# Patient Record
Sex: Male | Born: 1960 | Race: Black or African American | Hispanic: No | Marital: Single | Smoking: Never smoker
Health system: Southern US, Community
[De-identification: ages and names within clinical notes are randomized; demographics above are authoritative.]

## PROBLEM LIST (undated history)

## (undated) DIAGNOSIS — F329 Major depressive disorder, single episode, unspecified: Secondary | ICD-10-CM

## (undated) DIAGNOSIS — F32A Depression, unspecified: Secondary | ICD-10-CM

## (undated) DIAGNOSIS — K649 Unspecified hemorrhoids: Secondary | ICD-10-CM

## (undated) DIAGNOSIS — J45909 Unspecified asthma, uncomplicated: Secondary | ICD-10-CM

## (undated) DIAGNOSIS — E0590 Thyrotoxicosis, unspecified without thyrotoxic crisis or storm: Principal | ICD-10-CM

## (undated) DIAGNOSIS — T7840XA Allergy, unspecified, initial encounter: Secondary | ICD-10-CM

## (undated) HISTORY — DX: Depression, unspecified: F32.A

## (undated) HISTORY — DX: Thyrotoxicosis, unspecified without thyrotoxic crisis or storm: E05.90

## (undated) HISTORY — PX: TOE AMPUTATION: SHX809

## (undated) HISTORY — DX: Unspecified hemorrhoids: K64.9

## (undated) HISTORY — DX: Major depressive disorder, single episode, unspecified: F32.9

## (undated) HISTORY — PX: HEMORRHOID SURGERY: SHX153

## (undated) HISTORY — PX: ABDOMINAL HERNIA REPAIR: SHX539

## (undated) HISTORY — DX: Allergy, unspecified, initial encounter: T78.40XA

## (undated) HISTORY — DX: Unspecified asthma, uncomplicated: J45.909

---

## 1998-04-01 ENCOUNTER — Emergency Department (HOSPITAL_COMMUNITY): Admission: EM | Admit: 1998-04-01 | Discharge: 1998-04-01 | Payer: Self-pay | Admitting: Emergency Medicine

## 2011-12-26 DIAGNOSIS — Z23 Encounter for immunization: Secondary | ICD-10-CM | POA: Insufficient documentation

## 2011-12-26 DIAGNOSIS — S60459A Superficial foreign body of unspecified finger, initial encounter: Secondary | ICD-10-CM | POA: Insufficient documentation

## 2011-12-26 DIAGNOSIS — W268XXA Contact with other sharp object(s), not elsewhere classified, initial encounter: Secondary | ICD-10-CM | POA: Insufficient documentation

## 2011-12-27 ENCOUNTER — Encounter (HOSPITAL_BASED_OUTPATIENT_CLINIC_OR_DEPARTMENT_OTHER): Payer: Self-pay | Admitting: Emergency Medicine

## 2011-12-27 ENCOUNTER — Emergency Department (HOSPITAL_BASED_OUTPATIENT_CLINIC_OR_DEPARTMENT_OTHER): Payer: BC Managed Care – PPO

## 2011-12-27 ENCOUNTER — Emergency Department (HOSPITAL_BASED_OUTPATIENT_CLINIC_OR_DEPARTMENT_OTHER)
Admission: EM | Admit: 2011-12-27 | Discharge: 2011-12-27 | Disposition: A | Discharge status: Home / Self Care | Payer: BC Managed Care – PPO | Attending: Emergency Medicine | Admitting: Emergency Medicine

## 2011-12-27 DIAGNOSIS — S60459A Superficial foreign body of unspecified finger, initial encounter: Secondary | ICD-10-CM

## 2011-12-27 MED ORDER — TETANUS-DIPHTH-ACELL PERTUSSIS 5-2.5-18.5 LF-MCG/0.5 IM SUSP
0.5000 mL | Freq: Once | INTRAMUSCULAR | Status: AC
  Administered 2011-12-27: 0.5 mL via INTRAMUSCULAR
  Filled 2011-12-27: qty 0.5

## 2011-12-27 NOTE — ED Notes (Signed)
Pt reports having a splinter 3 days ago from his deck.  Pt removed splinter, and since his middle finger on his right hand has been swollen.  Pt states pain 5/10.  Limited movement of finger.

## 2011-12-27 NOTE — ED Provider Notes (Signed)
History     CSN: [id number]  Arrival date & time 12/26/11  2351   First MD Initiated Contact with Patient 12/27/11 0005      Chief Complaint  Patient presents with  . Finger Injury    (Consider location/radiation/quality/duration/timing/severity/associated sxs/prior treatment) HPI This is a 51 year old black male who got a splinter in the palmar aspect of the proximal phalanx of the right middle finger about a month ago. The splinter was from his back and he thought he removed the entire splinter at the time. That phalanx has subsequently become swollen and tender. The tenderness is primarily centered around the puncture site. He has a limited range of motion of that finger. There is minimal associated erythema. There's been no drainage. He has not been treating it with many medication.  No past medical history on file.  No past surgical history on file.  No family history on file.  History  Substance Use Topics  . Smoking status: Not on file  . Smokeless tobacco: Not on file  . Alcohol Use: Not on file      Review of Systems  All other systems reviewed and are negative.    Allergies  Review of patient's allergies indicates not on file.  Home Medications  No current outpatient prescriptions on file.  There were no vitals taken for this visit.  Physical Exam General: Well-developed, well-nourished male in no acute distress; appearance consistent with age of record HENT: normocephalic, atraumatic Eyes: Normal appearance Neck: supple Heart: regular rate and rhythm Lungs: Normal respiratory effort and excursion Abdomen: soft; nondistended Extremities: No deformity; swelling of proximal phalanx of right middle finger with palmar side tenderness, no drainage or fluctuance, no erythema or warmth Neurologic: Awake, alert and oriented; motor function intact in all extremities and symmetric; no facial droop Skin: Warm and dry Psychiatric: Normal mood and  affect    ED Course  Procedures (including critical care time)     MDM  Nursing notes and vitals signs, including pulse oximetry, reviewed.  Summary of this visit's results, reviewed by myself:  Imaging Studies: Dg Hand 2 View Right  12/27/2011  *RADIOLOGY REPORT*  Clinical Data: Middle finger pain and swelling.  RIGHT HAND - 2 VIEW  Comparison: None.  Findings: There is soft tissue swelling about the proximal phalanx of the third digit.  No radiopaque foreign body identified. Limited phalangeal evaluation on the lateral view due to overlap.  Mild PIP degenerative change.  IMPRESSION: Soft tissue swelling overlies the proximal phalanx third digit without acute osseous finding or radiopaque foreign body.   Original Report Authenticated By: Waneta Martins, M.D.    12:48 AM The patient's finger does not appear to have an acute abscess requiring immediate I&D. Because of the complexity of the fingers we will refer to the hand surgeon on-call for definitive treatment.         Hanley Seamen, MD 12/27/11 [phone]

## 2014-05-05 LAB — HM COLONOSCOPY

## 2015-02-09 ENCOUNTER — Telehealth: Payer: Self-pay | Admitting: Medical

## 2015-02-09 ENCOUNTER — Encounter: Payer: Self-pay | Admitting: Medical

## 2015-02-09 NOTE — Progress Notes (Signed)
This encounter was created in error - please disregard.

## 2015-02-13 ENCOUNTER — Ambulatory Visit: Payer: Self-pay | Admitting: Pediatrics

## 2015-02-15 ENCOUNTER — Telehealth: Payer: Self-pay | Admitting: Medical

## 2015-02-15 ENCOUNTER — Encounter: Payer: Self-pay | Admitting: Medical

## 2015-02-15 ENCOUNTER — Ambulatory Visit (INDEPENDENT_AMBULATORY_CARE_PROVIDER_SITE_OTHER): Payer: 59 | Admitting: Medical

## 2015-02-15 VITALS — BP 108/70 | HR 70 | Temp 98.1°F | Ht 69.25 in | Wt 152.8 lb

## 2015-02-15 DIAGNOSIS — J3089 Other allergic rhinitis: Secondary | ICD-10-CM

## 2015-02-15 DIAGNOSIS — R5383 Other fatigue: Secondary | ICD-10-CM | POA: Diagnosis not present

## 2015-02-15 DIAGNOSIS — F32A Depression, unspecified: Secondary | ICD-10-CM | POA: Insufficient documentation

## 2015-02-15 DIAGNOSIS — F329 Major depressive disorder, single episode, unspecified: Secondary | ICD-10-CM

## 2015-02-15 DIAGNOSIS — R634 Abnormal weight loss: Secondary | ICD-10-CM | POA: Diagnosis not present

## 2015-02-15 DIAGNOSIS — J45909 Unspecified asthma, uncomplicated: Secondary | ICD-10-CM

## 2015-02-15 DIAGNOSIS — Z113 Encounter for screening for infections with a predominantly sexual mode of transmission: Secondary | ICD-10-CM

## 2015-02-15 DIAGNOSIS — Z125 Encounter for screening for malignant neoplasm of prostate: Secondary | ICD-10-CM | POA: Diagnosis not present

## 2015-02-15 DIAGNOSIS — J309 Allergic rhinitis, unspecified: Secondary | ICD-10-CM | POA: Insufficient documentation

## 2015-02-15 DIAGNOSIS — R7989 Other specified abnormal findings of blood chemistry: Secondary | ICD-10-CM

## 2015-02-15 LAB — COMPREHENSIVE METABOLIC PANEL
ALBUMIN: 4 g/dL (ref 3.5–5.2)
ALK PHOS: 66 U/L (ref 39–117)
ALT: 14 U/L (ref 0–53)
AST: 15 U/L (ref 0–37)
BUN: 14 mg/dL (ref 6–23)
CALCIUM: 9.1 mg/dL (ref 8.4–10.5)
CO2: 30 mEq/L (ref 19–32)
Chloride: 102 mEq/L (ref 96–112)
Creatinine, Ser: 0.86 mg/dL (ref 0.40–1.50)
GFR: 119.98 mL/min (ref >60.00)
Glucose, Bld: 99 mg/dL (ref 70–99)
POTASSIUM: 4.6 meq/L (ref 3.5–5.1)
Sodium: 139 mEq/L (ref 135–145)
TOTAL PROTEIN: 6.8 g/dL (ref 6.0–8.3)
Total Bilirubin: 0.3 mg/dL (ref 0.2–1.2)

## 2015-02-15 LAB — CBC WITH DIFFERENTIAL/PLATELET
Basophils Absolute: 0 10*3/uL (ref 0.0–0.1)
Basophils Relative: 0.8 % (ref 0.0–3.0)
EOS PCT: 2.9 % (ref 0.0–5.0)
Eosinophils Absolute: 0.1 10*3/uL (ref 0.0–0.7)
HEMATOCRIT: 44.4 % (ref 39.0–52.0)
HEMOGLOBIN: 14.8 g/dL (ref 13.0–17.0)
LYMPHS ABS: 1.4 10*3/uL (ref 0.7–4.0)
Lymphocytes Relative: 38.5 % (ref 12.0–46.0)
MCHC: 33.3 g/dL (ref 30.0–36.0)
MCV: 91.1 fl (ref 78.0–100.0)
MONOS PCT: 11.7 % (ref 3.0–12.0)
Monocytes Absolute: 0.4 10*3/uL (ref 0.1–1.0)
Neutro Abs: 1.7 10*3/uL (ref 1.4–7.7)
Neutrophils Relative %: 46.1 % (ref 43.0–77.0)
Platelets: 248 10*3/uL (ref 150.0–400.0)
RBC: 4.88 Mil/uL (ref 4.22–5.81)
RDW: 13.3 % (ref 11.5–15.5)
WBC: 3.7 10*3/uL — AB (ref 4.0–10.5)

## 2015-02-15 LAB — LIPASE: Lipase: 26 U/L (ref 11.0–59.0)

## 2015-02-15 LAB — AMYLASE: AMYLASE: 46 U/L (ref 27–131)

## 2015-02-15 LAB — PSA: PSA: 0.46 ng/mL (ref 0.10–4.00)

## 2015-02-15 LAB — TSH: TSH: 0.33 u[IU]/mL — AB (ref 0.35–4.50)

## 2015-02-15 NOTE — Progress Notes (Signed)
Pre visit review using our clinic review tool, if applicable. No additional management support is needed unless otherwise documented below in the visit note. 

## 2015-02-15 NOTE — Progress Notes (Signed)
Subjective:    Patient ID: Jon Reyes, male    DOB: [birth year], 54 y.o.   MRN: [id number]  HPI   I have reviewed pt PMH, PSH, FH, Social History and Surgical History.     Pt states last 2 wks he has felt fatigued. Pt has some weight loss gradual weight loss over 1.5 years. He used to weight 173 lb. Now 152.8 lb. He started to eat a lot healthier over past year. He does some daily exercise about 20 minutes.  Hemmoroids-  Pt has some recently. 2 wks ago given suppository. Saw GI.  Allergies- some nasal congestion recently. Pt seeing allergist tomorrow.  Asthma- hx of in past years ago but none recently. Pt was born premature. His birth weight 3 lbs 2 oz.  Depression- brief after death of his father. He was on medication briefly.  Family hx- pt thinks dad died from cardiovascular problems.Though he is not sure.  Pt last colonoscopy. Had polyp. But no other finding. The path report per pt stated no cancer.     Review of Systems  Constitutional: Positive for fatigue. Negative for fever and chills.  HENT: Positive for congestion. Negative for dental problem, ear discharge, hearing loss, postnasal drip, rhinorrhea, sinus pressure, sneezing and sore throat.   Respiratory: Positive for cough. Negative for chest tightness, shortness of breath and wheezing.        Rare occasional cough.  Cardiovascular: Negative for chest pain and palpitations.  Gastrointestinal: Negative for abdominal pain.  Endocrine: Negative for polydipsia, polyphagia and polyuria.  Genitourinary: Negative for dysuria, frequency, flank pain, decreased urine volume, scrotal swelling, difficulty urinating, penile pain and testicular pain.  Musculoskeletal: Negative for back pain.  Skin: Negative for rash.  Neurological: Negative for dizziness and headaches.  Hematological: Negative for adenopathy. Does not bruise/bleed easily.  Psychiatric/Behavioral: Negative for behavioral problems and confusion.      Past Medical History  Diagnosis Date  . Allergy     nasal congestion recently. Seeing allergist tomorrow.  . Asthma   . Depression     Social History   Social History  . Marital Status: Single    Spouse Name: N/A  . Number of Children: N/A  . Years of Education: N/A   Occupational History  . Not on file.   Social History Main Topics  . Smoking status: Never Smoker   . Smokeless tobacco: Not on file  . Alcohol Use: Yes     Comment: very rare seldom.  . Drug Use: No  . Sexual Activity: Yes   Other Topics Concern  . Not on file   Social History Narrative    Past Surgical History  Procedure Laterality Date  . Abdominal hernia repair    . Toe amputation      right foot    Family History  Problem Relation Age of Onset  . Heart disease Mother   . Heart disease Father     No Known Allergies  No current outpatient prescriptions on file prior to visit.   No current facility-administered medications on file prior to visit.    BP 108/70 mmHg  Pulse 70  Temp(Src) 98.1 F (36.7 C) (Oral)  Ht 5' 9.25" (1.759 m)  Wt 152 lb 12.8 oz (69.31 kg)  BMI 22.40 kg/m2  SpO2 98%       Objective:   Physical Exam  General  Mental Status - Alert. General Appearance - Well groomed. Not in acute distress. But thin.  Skin Rashes- No Rashes.  HEENT Head- Normal. Ear Auditory Canal - Left- Normal. Right - Normal.Tympanic Membrane- Left- Normal. Right- Normal. Eye Sclera/Conjunctiva- Left- Normal. Right- Normal. Nose & Sinuses Nasal Mucosa- Left-  Not boggy or Congested. Right-  Not  boggy or Congested. Mouth & Throat Lips: Upper Lip- Normal: no dryness, cracking, pallor, cyanosis, or vesicular eruption. Lower Lip-Normal: no dryness, cracking, pallor, cyanosis or vesicular eruption. Buccal Mucosa- Bilateral- No Aphthous ulcers. Oropharynx- No Discharge or Erythema. Tonsils: Characteristics- Bilateral- No Erythema or Congestion. Size/Enlargement- Bilateral- No  enlargement. Discharge- bilateral-None.  Neck Neck- Supple. No Masses.   Chest and Lung Exam Auscultation: Breath Sounds:- even and unlabored  Cardiovascular Auscultation:Rythm- Regular, rate and rhythm. Murmurs & Other Heart Sounds:Ausculatation of the heart reveal- No Murmurs.  Lymphatic Head & Neck General Head & Neck Lymphatics: Bilateral: Description- No Localized lymphadenopathy.   Abdomen Inspection:-Inspection Normal.  Palpation/Perucssion: Palpation and Percussion of the abdomen reveal- Non Tender, No Rebound tenderness, No rigidity(Guarding) and No Palpable abdominal masses.  Liver:-Normal.  Spleen:- Normal.   Rectal Anorectal Exam: Stool - Hemoccult of stool/mucous is Heme Negative. External - normal external exam. Internal - normal sphincter tone. No rectal mass. Prostate felt smooth.      Assessment & Plan:

## 2015-02-15 NOTE — Patient Instructions (Signed)
Allergic rhinitis Will see allergist tomorrow. Mostly has nasal congestion.  Asthma Remote past. Controlled. No symptoms for years.  Depression None presently. Hx of after dad passed. Was on meds briefly.    For weight loss and fatigue did get cbc,cmp, tsh, hiv, hep c, amylase, lipase, and psa labs.  Follow up in 3 wks or as needed. Will see if losing more weight. Follow up important.

## 2015-02-15 NOTE — Assessment & Plan Note (Signed)
Will see allergist tomorrow. Mostly has nasal congestion.

## 2015-02-15 NOTE — Assessment & Plan Note (Signed)
None presently. Hx of after dad passed. Was on meds briefly.

## 2015-02-15 NOTE — Assessment & Plan Note (Signed)
For fatigue did get cbc,cmp, tsh, hiv, hep c.

## 2015-02-15 NOTE — Telephone Encounter (Signed)
I called pt with lab results. notifie mild low wbc. Will need to repeat this in 3 wks when he is in for follow up. His tsh is mild low. With his weight loss over last 1.5 yrs will go ahead and refer to endocrinologist. Get opinion if he is hyperthyroid. hiv and hep c test pending.

## 2015-02-15 NOTE — Assessment & Plan Note (Signed)
Remote past. Controlled. No symptoms for years.

## 2015-02-15 NOTE — Assessment & Plan Note (Signed)
For weight loss and fatigue did get cbc,cmp, tsh, hiv, hep c, amylase, lipase, and psa labs

## 2015-02-16 LAB — HIV ANTIBODY (ROUTINE TESTING W REFLEX): HIV: NONREACTIVE

## 2015-02-16 LAB — HEPATITIS C ANTIBODY: HCV Ab: NEGATIVE

## 2015-02-19 NOTE — Telephone Encounter (Signed)
No charge. 

## 2015-02-19 NOTE — Telephone Encounter (Signed)
Pt was no show 02/09/15 1:30pm for new pt appt, pt came in 02/15/15, charge or no charge?

## 2015-02-28 ENCOUNTER — Encounter: Payer: Self-pay | Admitting: Endocrinology

## 2015-02-28 ENCOUNTER — Ambulatory Visit (INDEPENDENT_AMBULATORY_CARE_PROVIDER_SITE_OTHER): Payer: 59 | Admitting: Endocrinology

## 2015-02-28 VITALS — BP 132/87 | HR 88 | Temp 99.0°F | Ht 69.25 in | Wt 149.0 lb

## 2015-02-28 DIAGNOSIS — E0590 Thyrotoxicosis, unspecified without thyrotoxic crisis or storm: Secondary | ICD-10-CM

## 2015-02-28 HISTORY — DX: Thyrotoxicosis, unspecified without thyrotoxic crisis or storm: E05.90

## 2015-02-28 NOTE — Progress Notes (Signed)
Subjective:    Patient ID: Jon Reyes, male    DOB: March 19, 1961, 54 y.o.   MRN: [id number]  HPI A few weeks ago, pt was noted on routine labs to have suppressed TSH.  He has no prior h/o thyroid problems.  He has never had XRT to the anterior neck, or thyroid surgery.  He has never had thyroid imaging.  He does not consume kelp or any other prescribed or non-prescribed thyroid medication.  He has never been on amiodarone.  He has minimal tremor of the hands, and assoc weight loss (10 lbs).  Past Medical History  Diagnosis Date  . Allergy     nasal congestion recently. Seeing allergist tomorrow.  . Asthma   . Depression   . Hyperthyroidism 02/28/2015    Past Surgical History  Procedure Laterality Date  . Abdominal hernia repair    . Toe amputation      right foot    Social History   Social History  . Marital Status: Single    Spouse Name: N/A  . Number of Children: N/A  . Years of Education: N/A   Occupational History  . Not on file.   Social History Main Topics  . Smoking status: Never Smoker   . Smokeless tobacco: Not on file  . Alcohol Use: Yes     Comment: very rare seldom.  . Drug Use: No  . Sexual Activity: Yes   Other Topics Concern  . Not on file   Social History Narrative    No current outpatient prescriptions on file prior to visit.   No current facility-administered medications on file prior to visit.    No Known Allergies  Family History  Problem Relation Age of Onset  . Heart disease Mother   . Heart disease Father   . Thyroid disease Neg Hx     BP 132/87 mmHg  Pulse 88  Temp(Src) 99 F (37.2 C) (Oral)  Ht 5' 9.25" (1.759 m)  Wt 149 lb (67.586 kg)  BMI 21.84 kg/m2  SpO2 97%    Review of Systems denies fever, headache, hoarseness, visual loss, palpitations, sob, polyuria, muscle weakness, excessive diaphoresis, numbness, anxiety, heat intolerance, easy bruising, and rhinorrhea.  He has had diarrhea recently.       Objective:    Physical Exam VS: see vs page GEN: no distress HEAD: head: no deformity eyes: no periorbital swelling, no proptosis external nose and ears are normal mouth: no lesion seen NECK: supple, thyroid is not enlarged CHEST WALL: no deformity LUNGS: clear to auscultation.   BREASTS:  No gynecomastia. CV: reg rate and rhythm, no murmur. ABD: abdomen is soft, nontender.  no hepatosplenomegaly.  not distended.  no hernia MUSCULOSKELETAL: muscle bulk and strength are grossly normal.  no obvious joint swelling.  gait is normal and steady EXTEMITIES: no deformity.  no ulcer on the feet.  feet are of normal color and temp.  no edema PULSES: dorsalis pedis intact bilat.   NEURO:  cn 2-12 grossly intact.   readily moves all 4's.  sensation is intact to touch on the feet.  No tremor.   SKIN:  Normal texture and temperature.  No rash or suspicious lesion is visible.   NODES:  None palpable at the neck.   PSYCH: alert, well-oriented.  Does not appear anxious nor depressed.    Lab Results  Component Value Date   TSH 0.33* 02/15/2015   I have reviewed outside records, and summarized:  Pt was noted to have  suppressed TSH, and ref here.     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  Usually due to small multinodular goiter.   Weight loss: not thyroid-related  Patient is advised the following: Patient Instructions  You have a very mildly overactive thyroid.  This is usually caused by tiny nodules on the thyroid.  No treatment is needed now. Please repeat the blood tests in 1 month.   If it is normal, please have it rechecked with you annual physical next year.   most of the time, a "lumpy thyroid" will eventually become overactive.  this is usually a slow process, happening over the span of many years.

## 2015-02-28 NOTE — Patient Instructions (Signed)
You have a very mildly overactive thyroid.  This is usually caused by tiny nodules on the thyroid.  No treatment is needed now. Please repeat the blood tests in 1 month.   If it is normal, please have it rechecked with you annual physical next year.   most of the time, a "lumpy thyroid" will eventually become overactive.  this is usually a slow process, happening over the span of many years.

## 2015-03-08 ENCOUNTER — Encounter: Payer: Self-pay | Admitting: Behavioral Health

## 2015-03-08 ENCOUNTER — Telehealth: Payer: Self-pay | Admitting: Behavioral Health

## 2015-03-08 NOTE — Addendum Note (Signed)
Addended by: Eduard Roux E on: 03/08/2015 11:26 AM   Modules accepted: Medications

## 2015-03-08 NOTE — Telephone Encounter (Signed)
Unable to reach patient at time of Pre-Visit Call.  Left message for patient to return call when available.    

## 2015-03-08 NOTE — Telephone Encounter (Signed)
Pre-Visit Call completed with patient and chart updated.   Pre-Visit Info documented in Specialty Comments under [employer].    

## 2015-03-09 ENCOUNTER — Ambulatory Visit (HOSPITAL_BASED_OUTPATIENT_CLINIC_OR_DEPARTMENT_OTHER)
Admission: RE | Admit: 2015-03-09 | Discharge: 2015-03-09 | Disposition: A | Discharge status: Home / Self Care | Payer: 59 | Source: Ambulatory Visit | Attending: Medical | Admitting: Medical

## 2015-03-09 ENCOUNTER — Encounter: Payer: Self-pay | Admitting: Medical

## 2015-03-09 ENCOUNTER — Telehealth: Payer: Self-pay | Admitting: Medical

## 2015-03-09 ENCOUNTER — Ambulatory Visit (INDEPENDENT_AMBULATORY_CARE_PROVIDER_SITE_OTHER): Payer: 59 | Admitting: Medical

## 2015-03-09 VITALS — BP 110/78 | HR 70 | Temp 98.1°F | Ht 69.25 in | Wt 155.0 lb

## 2015-03-09 DIAGNOSIS — R7989 Other specified abnormal findings of blood chemistry: Secondary | ICD-10-CM

## 2015-03-09 DIAGNOSIS — R634 Abnormal weight loss: Secondary | ICD-10-CM

## 2015-03-09 DIAGNOSIS — R946 Abnormal results of thyroid function studies: Secondary | ICD-10-CM

## 2015-03-09 DIAGNOSIS — Z720 Tobacco use: Secondary | ICD-10-CM

## 2015-03-09 DIAGNOSIS — Z87891 Personal history of nicotine dependence: Secondary | ICD-10-CM | POA: Insufficient documentation

## 2015-03-09 DIAGNOSIS — R5383 Other fatigue: Secondary | ICD-10-CM

## 2015-03-09 LAB — T3: T3 TOTAL: 92.3 ng/dL (ref 80.0–204.0)

## 2015-03-09 LAB — T4: T4 TOTAL: 5 ug/dL (ref 4.5–12.0)

## 2015-03-09 LAB — TSH: TSH: 0.51 u[IU]/mL (ref 0.35–4.50)

## 2015-03-09 NOTE — Patient Instructions (Signed)
I will get tsh, t3 and t4 today.  Will refer to Gi for weight loss and decrease appetite. May need egd. Get old colnosocopy report for them to review.  For hx of smoking will get cxr.  Continue to try to get in 2000 cal a day.  Follow up 7 days or prn

## 2015-03-09 NOTE — Telephone Encounter (Signed)
Spoke with pt and he voices understanding.  

## 2015-03-09 NOTE — Progress Notes (Signed)
Subjective:    Patient ID: Jon Reyes, male    DOB: [birth year], 54 y.o.   MRN: [id number]  HPI   Pt in for follow up. Pt did see endocrine. Thought is he is mildly overactive thyroid. No medication needed. Will repeat blood work in 6 months. Dr. Loanne Drilling did not do any labs.   All of pt labs were negative.  Pt is eating a little more.  Pt does not think his intake of calories reaches 2000. Pt does not exercise much. Pt just recently started using protein powder to increase his muscle mass. Pt appetitite been decreased for one year. He eats one meal a day. And then light snacking.  On review pt states that when he did weight 173 lb he ate very poorly. Eating a lot of fast foods and he drank a lot of beer.  When he stopped drinking and started eat very healthy then he lost weight.   Pt used to smoke off and on for 20 years. He stopped 6-7 years.     Review of Systems  Constitutional: Positive for fatigue. Negative for fever and chills.  Respiratory: Negative for cough, chest tightness, shortness of breath and wheezing.   Cardiovascular: Negative for chest pain and palpitations.  Gastrointestinal: Negative for nausea, vomiting, abdominal pain, diarrhea, constipation, blood in stool, abdominal distention and rectal pain.  Musculoskeletal: Negative for myalgias and back pain.  Skin: Negative for pallor and rash.  Neurological: Negative for dizziness, numbness and headaches.  Hematological: Negative for adenopathy. Does not bruise/bleed easily.  Psychiatric/Behavioral: Negative for behavioral problems.    Past Medical History  Diagnosis Date  . Allergy     nasal congestion recently. Seeing allergist tomorrow.  . Asthma   . Depression   . Hyperthyroidism 02/28/2015  . Hemorrhoids     Social History   Social History  . Marital Status: Single    Spouse Name: N/A  . Number of Children: N/A  . Years of Education: N/A   Occupational History  . Not on file.   Social  History Main Topics  . Smoking status: Never Smoker   . Smokeless tobacco: Not on file  . Alcohol Use: Yes     Comment: very rare seldom.  . Drug Use: No  . Sexual Activity: Yes   Other Topics Concern  . Not on file   Social History Narrative    Past Surgical History  Procedure Laterality Date  . Abdominal hernia repair    . Toe amputation      right foot  . Hemorrhoid surgery      Family History  Problem Relation Age of Onset  . Heart disease Mother   . Heart disease Father   . Thyroid disease Neg Hx     No Known Allergies  Current Outpatient Prescriptions on File Prior to Visit  Medication Sig Dispense Refill  . Azelastine-Fluticasone (DYMISTA) 137-50 MCG/ACT SUSP Place into the nose.    . levocetirizine (XYZAL) 5 MG tablet Take 5 mg by mouth every evening.    . montelukast (SINGULAIR) 10 MG tablet Take 10 mg by mouth at bedtime.    . Multiple Vitamins-Minerals (MULTIVITAMIN WITH MINERALS) tablet Take 1 tablet by mouth daily.     No current facility-administered medications on file prior to visit.    BP 110/78 mmHg  Pulse 70  Temp(Src) 98.1 F (36.7 C) (Oral)  Ht 5' 9.25" (1.759 m)  Wt 155 lb (70.308 kg)  BMI 22.72 kg/m2  SpO2 97%  Objective:   Physical Exam  General- No acute distress. Pleasant patient. Neck- Full range of motion, no jvd Lungs- Clear, even and unlabored. Heart- regular rate and rhythm. Neurologic- CNII- XII grossly intact.       Assessment & Plan:  I will get tsh, t3 and t4 today.  Will refer to Gi for weight loss and decrease appetite. May need egd. Get old colnosocopy report for them to review.  For hx of smoking will get cxr.  Continue to try to get in 2000 cal a day.  Follow up 7 days or prn

## 2015-03-09 NOTE — Telephone Encounter (Signed)
Patient states he received a message from you  about his lab results. Plse call back at [phone]

## 2015-03-09 NOTE — Progress Notes (Signed)
Pre visit review using our clinic review tool, if applicable. No additional management support is needed unless otherwise documented below in the visit note. 

## 2015-04-02 ENCOUNTER — Other Ambulatory Visit (INDEPENDENT_AMBULATORY_CARE_PROVIDER_SITE_OTHER): Payer: 59

## 2015-04-02 DIAGNOSIS — E0590 Thyrotoxicosis, unspecified without thyrotoxic crisis or storm: Secondary | ICD-10-CM

## 2015-04-02 LAB — TSH: TSH: 1.37 u[IU]/mL (ref 0.35–4.50)

## 2015-04-02 LAB — T4, FREE: Free T4: 0.81 ng/dL (ref 0.60–1.60)

## 2015-04-04 ENCOUNTER — Telehealth: Payer: Self-pay | Admitting: *Deleted

## 2015-04-04 NOTE — Telephone Encounter (Signed)
Forwarded to [employer]. JG//CMA

## 2015-04-08 ENCOUNTER — Telehealth: Payer: Self-pay | Admitting: Medical

## 2015-04-08 NOTE — Telephone Encounter (Signed)
I have reviewed pt old records from [place]. I still want him to see gastroenterologist. Looks like referral and effort was made to contact pt but they were not successful . Will you call pt and send letter explaining that I still think referral is important.Sending this to both referral staff and Barnet Pall MA. Reactivate prior referral.

## 2015-04-09 NOTE — Telephone Encounter (Signed)
Referral placed in [facility] work que/awaiting appt

## 2015-04-09 NOTE — Telephone Encounter (Signed)
Spoke with pt and he voices understanding.  

## 2015-04-18 ENCOUNTER — Telehealth: Payer: Self-pay | Admitting: Medical

## 2015-04-18 NOTE — Telephone Encounter (Signed)
That referral was made. The pages I marked summarize inadequate prep but a lot of polyps. Will other GI office see pt.

## 2015-04-25 ENCOUNTER — Telehealth: Payer: Self-pay | Admitting: Medical

## 2015-04-25 NOTE — Telephone Encounter (Signed)
Documented 04/25/15.

## 2015-04-25 NOTE — Telephone Encounter (Signed)
Per pt had flu shot here in office, he thinks at [facility] appt in November

## 2015-05-03 ENCOUNTER — Telehealth: Payer: Self-pay | Admitting: Gastroenterology

## 2015-05-03 NOTE — Telephone Encounter (Addendum)
Dr. Nandigam reviewed records and has declined to accept patient. Left message for patient to return my call.  °

## 2015-05-08 NOTE — Telephone Encounter (Signed)
Let pt know that I tried  to refer him to other GI but after they reviewed records other GI would not accept  since he has former GI. Would you let pt know I think it is important to work up his weight loss. I think he needs egd.. I think pt expressed some  dissatisfaction with former GI and that is why referred to other. But at this point I do think he needs to see GI. Please call pt and advise him to this effect. I will go ahead and send this note referral so they can refer him back to former GI. Document his response and let me know what he says. Also 2 months since I last saw him so would like him to come in for office visit and weight check.

## 2015-05-09 NOTE — Telephone Encounter (Signed)
Referral has been sent to former GI Jon Reyes), awaiting appt. I have no spoken to the patient.

## 2015-05-09 NOTE — Telephone Encounter (Signed)
Left message for pt to call back to set up an appointment with Percell Miller and advised pt that a referral has been placed.

## 2015-06-05 ENCOUNTER — Encounter: Payer: Self-pay | Admitting: Family Medicine

## 2015-06-05 ENCOUNTER — Ambulatory Visit (INDEPENDENT_AMBULATORY_CARE_PROVIDER_SITE_OTHER): Payer: 59 | Admitting: Family Medicine

## 2015-06-05 ENCOUNTER — Telehealth: Payer: Self-pay | Admitting: Medical

## 2015-06-05 VITALS — BP 120/70 | HR 76 | Temp 98.2°F | Wt 157.8 lb

## 2015-06-05 DIAGNOSIS — R197 Diarrhea, unspecified: Secondary | ICD-10-CM

## 2015-06-05 MED ORDER — HYOSCYAMINE SULFATE 0.125 MG SL SUBL: 0.1250 mg | SUBLINGUAL_TABLET | SUBLINGUAL | Status: DC | PRN

## 2015-06-05 NOTE — Telephone Encounter (Signed)
Noted  

## 2015-06-05 NOTE — Telephone Encounter (Signed)
Patient Name: JEFF Custodio  DOB: [birth year]    Initial Comment Caller states has been having diarrhea - bought anti diarrhea but it is not working   Nurse Assessment  Nurse: Mallie Mussel, Therapist, sports, Alveta Heimlich Date/Time (Eastern Time): 06/05/2015 12:50:53 PM  Confirm and document reason for call. If symptomatic, describe symptoms. You must click the next button to save text entered. ---Caller states that he has had diarrhea for about a month now. He has had diarrhea x 7 in the last 24 hours. He has not made it to the bathroom in time twice. Denies fever. He last urinated about 30 minutes ago. He has had some abdominal pains which comes and goes.  Has the patient traveled out of the country within the last 30 days? ---No  Does the patient have any new or worsening symptoms? ---Yes  Will a triage be completed? ---Yes  Related visit to physician within the last 2 weeks? ---No  Does the PT have any chronic conditions? (i.e. diabetes, asthma, etc.) ---No  Is this a behavioral health or substance abuse call? ---No     Guidelines    Guideline Title Affirmed Question Affirmed Notes  Diarrhea [1] MODERATE diarrhea (e.g., 4-6 times / day more than normal) AND [2] present > 48 hours (2 days)    Final Disposition User   See Physician within [place], RN, Alveta Heimlich    Comments  [employer] PA does not have any afternoon appointments this afternoon. I was able to schedule an appointment for 2:30pm with Dr. Garnet Koyanagi for today.   Referrals  REFERRED TO PCP OFFICE   Disagree/Comply: Comply

## 2015-06-05 NOTE — Progress Notes (Signed)
Pre visit review using our clinic review tool, if applicable. No additional management support is needed unless otherwise documented below in the visit note. 

## 2015-06-05 NOTE — Progress Notes (Signed)
  Subjective:     Jon Reyes is a 55 y.o. male who presents for evaluation of diarrhea. Onset of diarrhea was 1 month ago. Diarrhea is occurring approximately 4 times per day. Patient describes diarrhea as bloody and watery. Diarrhea has been associated with weight loss of 20 lbs since april 2016. Patient denies fever, illness in household contacts, recent antibiotic use, recent camping, recent travel, significant abdominal pain. Previous visits for diarrhea: none. Evaluation to date: none.  Treatment to date: otc anti diarrheal.  The following portions of the patient's history were reviewed and updated as appropriate:  He  has a past medical history of Allergy; Asthma; Depression; Hemorrhoids; and Hyperthyroidism (02/28/2015). He  does not have any pertinent problems on file. He  has past surgical history that includes Abdominal hernia repair; Toe amputation; and Hemorrhoid surgery. His family history includes Heart disease in his father and mother. There is no history of Thyroid disease. He  reports that he has never smoked. He does not have any smokeless tobacco history on file. He reports that he drinks alcohol. He reports that he does not use illicit drugs. He has a current medication list which includes the following prescription(s): anucort-hc, azelastine-fluticasone, hyoscyamine, levocetirizine, montelukast, and multivitamin with minerals. Current Outpatient Prescriptions on File Prior to Visit  Medication Sig Dispense Refill  . ANUCORT-HC 25 MG suppository UNW AND I 1 SUP REC D  1  . Azelastine-Fluticasone (DYMISTA) 137-50 MCG/ACT SUSP Place into the nose.    . levocetirizine (XYZAL) 5 MG tablet Take 5 mg by mouth every evening.    . montelukast (SINGULAIR) 10 MG tablet Take 10 mg by mouth at bedtime.    . Multiple Vitamins-Minerals (MULTIVITAMIN WITH MINERALS) tablet Take 1 tablet by mouth daily.     No current facility-administered medications on file prior to visit.   He has No  Known Allergies..  Review of Systems Pertinent items are noted in HPI.    Objective:    BP 120/70 mmHg  Pulse 76  Temp(Src) 98.2 F (36.8 C) (Oral)  Wt 157 lb 12.8 oz (71.578 kg)  SpO2 98% General: alert, cooperative, appears stated age and no distress  Hydration:  well hydrated  Abdomen:    soft, non-tender; bowel sounds normal; no masses,  no organomegaly    Assessment:    Diarrhea of uncertain etiology, severe in severity   Plan:    Appropriate educational material discussed and distributed. Discussed the appropriate management of diarrhea. Follow up as needed. GI consult. Lab studies per orders. Stool studies per orders.

## 2015-06-05 NOTE — Patient Instructions (Signed)

## 2015-06-06 LAB — CBC WITH DIFFERENTIAL/PLATELET
BASOS ABS: 0.1 10*3/uL (ref 0.0–0.1)
Basophils Relative: 1.3 % (ref 0.0–3.0)
EOS ABS: 0.1 10*3/uL (ref 0.0–0.7)
Eosinophils Relative: 1.6 % (ref 0.0–5.0)
HEMATOCRIT: 42.7 % (ref 39.0–52.0)
HEMOGLOBIN: 13.7 g/dL (ref 13.0–17.0)
LYMPHS PCT: 36.8 % (ref 12.0–46.0)
Lymphs Abs: 2.1 10*3/uL (ref 0.7–4.0)
MCHC: 32.2 g/dL (ref 30.0–36.0)
MCV: 93.6 fl (ref 78.0–100.0)
MONOS PCT: 8.6 % (ref 3.0–12.0)
Monocytes Absolute: 0.5 10*3/uL (ref 0.1–1.0)
NEUTROS ABS: 2.9 10*3/uL (ref 1.4–7.7)
Neutrophils Relative %: 51.7 % (ref 43.0–77.0)
PLATELETS: 321 10*3/uL (ref 150.0–400.0)
RBC: 4.56 Mil/uL (ref 4.22–5.81)
RDW: 13.7 % (ref 11.5–15.5)
WBC: 5.6 10*3/uL (ref 4.0–10.5)

## 2015-06-06 LAB — COMPREHENSIVE METABOLIC PANEL
ALBUMIN: 4 g/dL (ref 3.5–5.2)
ALK PHOS: 76 U/L (ref 39–117)
ALT: 13 U/L (ref 0–53)
AST: 17 U/L (ref 0–37)
BILIRUBIN TOTAL: 0.2 mg/dL (ref 0.2–1.2)
BUN: 14 mg/dL (ref 6–23)
CALCIUM: 9.5 mg/dL (ref 8.4–10.5)
CO2: 27 mEq/L (ref 19–32)
CREATININE: 0.89 mg/dL (ref 0.40–1.50)
Chloride: 101 mEq/L (ref 96–112)
GFR: 115.19 mL/min (ref >60.00)
Glucose, Bld: 75 mg/dL (ref 70–99)
Potassium: 4 mEq/L (ref 3.5–5.1)
Sodium: 137 mEq/L (ref 135–145)
TOTAL PROTEIN: 7.6 g/dL (ref 6.0–8.3)

## 2015-06-06 LAB — THYROID PANEL WITH TSH
FREE THYROXINE INDEX: 1.7 (ref 1.4–3.8)
T3 Uptake: 33 % (ref 22–35)
T4, Total: 5.2 ug/dL (ref 4.5–12.0)
TSH: 1.138 u[IU]/mL (ref 0.350–4.500)

## 2015-06-27 ENCOUNTER — Ambulatory Visit: Payer: 59 | Admitting: Medical

## 2015-08-10 ENCOUNTER — Telehealth: Payer: Self-pay | Admitting: Medical

## 2015-08-10 NOTE — Telephone Encounter (Signed)
Will call pt and if pt is on 25 then up to 50 mg. Will call pt on Monday to clarify dose he is currently taking. Per PCP pt dose can be changed when we know the current dose pt is on at this time.

## 2015-08-10 NOTE — Telephone Encounter (Signed)
Pt called in because he says that he was on a higher dosage of viagra when he was seen his old primary provider. He would like to know if PCP could up his dosage based on medical history from old provider? Advised pt that medical records are needed. Pt would like a call back to discuss further.     CB: [phone]

## 2015-08-13 MED ORDER — SILDENAFIL CITRATE 25 MG PO TABS: 25.0000 mg | ORAL_TABLET | Freq: Every day | ORAL | Status: DC | PRN

## 2015-08-13 NOTE — Telephone Encounter (Signed)
I did refill his viagra.They should be able to give him generic. Any problems filling rx let us know.

## 2015-08-13 NOTE — Telephone Encounter (Signed)
Per last note pt can start on low dose and pt wants to start on the low dose of viagra and pt states he will follow up within a month of starting the medication. Pt also states that he was previously on the generic for viagra and he wants to try that dose and see how it helps. Patient will call back to set up an appointment within a month.

## 2015-08-16 ENCOUNTER — Telehealth: Payer: Self-pay | Admitting: Medical

## 2015-08-16 MED ORDER — SILDENAFIL CITRATE 20 MG PO TABS: 20.0000 mg | ORAL_TABLET | ORAL | Status: DC

## 2015-08-16 NOTE — Telephone Encounter (Signed)
I got a message from [place] who we sent viagra in for and the pt only wants the sildenafil at this time and the pharmacist states that a pt may take 2-4 tablets and they send in 50 at a time. Please advise. Per PCP verbal order for pt to take 2-4 tablets daily as needed and not to take more than 4 in a 24 hour period.

## 2015-08-16 NOTE — Telephone Encounter (Signed)
Caller name: Self  Can be reached:  [phone]   Pharmacy:  [facility], [place] [address] [phone] (Phone) [phone] (Fax)         Reason for call: Patient states that he did not pick up the rx for Viagra because he wants to try the generic form first. Request rx for   sildenafil 20mg s

## 2015-10-18 ENCOUNTER — Telehealth: Payer: Self-pay | Admitting: Medical

## 2015-10-18 DIAGNOSIS — R634 Abnormal weight loss: Secondary | ICD-10-CM

## 2015-10-18 DIAGNOSIS — K625 Hemorrhage of anus and rectum: Secondary | ICD-10-CM

## 2015-10-18 NOTE — Telephone Encounter (Signed)
I have not seen pt for a while. Since November. He needs a follow up appointment. He reports rectal bleeding. He needs to be seen today or tomorrow. I am only here half a day tomorrow. He needs stat labs and looks like he was referred to GI. We need to get him in with GI. Will send message to Anderson [place] to see if that was ever made.

## 2015-10-18 NOTE — Telephone Encounter (Signed)
Please advise if pt will need to come in for an appointment.  I attempted to reach pt and his VM is full.

## 2015-10-18 NOTE — Telephone Encounter (Signed)
Pt called in because he says that he is experiencing more bleeding from his rectum . He asked if he could repeat what he was advised to do before. Pt says that he is trying to prevent coming in to the office.    Please advise

## 2015-10-18 NOTE — Telephone Encounter (Signed)
In [facility] GI work que/awaiting appt

## 2015-10-18 NOTE — Telephone Encounter (Signed)
Pt referred to GI. Looks like not done. Did he go. Will you reactivate it.

## 2015-10-18 NOTE — Telephone Encounter (Signed)
Attempted to reach pt and Pts VM is full. Will try again later. Mychart message also sent as well.

## 2015-10-18 NOTE — Telephone Encounter (Signed)
[facility] denied to see patient, referral faxed to [facility]/awaiting appt

## 2015-11-12 ENCOUNTER — Ambulatory Visit (INDEPENDENT_AMBULATORY_CARE_PROVIDER_SITE_OTHER): Payer: 59 | Admitting: Medical

## 2015-11-12 ENCOUNTER — Encounter: Payer: Self-pay | Admitting: Medical

## 2015-11-12 ENCOUNTER — Telehealth: Payer: Self-pay | Admitting: Medical

## 2015-11-12 VITALS — BP 100/70 | HR 78 | Temp 98.1°F | Ht 69.25 in | Wt 159.3 lb

## 2015-11-12 DIAGNOSIS — R634 Abnormal weight loss: Secondary | ICD-10-CM | POA: Diagnosis not present

## 2015-11-12 DIAGNOSIS — R195 Other fecal abnormalities: Secondary | ICD-10-CM | POA: Diagnosis not present

## 2015-11-12 MED ORDER — HYOSCYAMINE SULFATE ER 0.375 MG PO TB12: 0.3750 mg | ORAL_TABLET | Freq: Two times a day (BID) | ORAL | Status: DC

## 2015-11-12 NOTE — Progress Notes (Addendum)
Subjective:    Patient ID: Jon Reyes, male    DOB: [birth year], 55 y.o.   MRN: [id number]  HPI  Pt in states just recently last 2 days had loose stools. 6 loose stools today and 4 today. Pt states pattern of couple of days in past with loose stools followed by normal stools. Pt in past thinks when drinks coffee has loose stool. No fever, chills or sweats over last 2 days. Intermittent episodes of loose stools occuring on and off for about 1.5 years.    Pt in past had some weight loss(pt did gain some weight since his first visit here). Pt was referred to Gi in [place] for that. Pt had normal colnoscopy on 05-05-2014 per chart/epic. Pt state work up at [place] was negative. He had negative imaging studies. He states he thinks it was CT Abdomen. He is not sure. In 05-2015 thyroid panel was negative. Pt psa on 02-16-2015 was 0.46.     Review of Systems  Constitutional: Positive for unexpected weight change. Negative for fever, chills and fatigue.       Increased most recently.  Respiratory: Negative for cough, chest tightness and wheezing.   Cardiovascular: Negative for chest pain and palpitations.  Gastrointestinal: Positive for diarrhea. Negative for nausea, vomiting, abdominal pain, constipation and abdominal distention.       Occasional bright red blood when wipes. No black stools. He state [place] Did not find anything.  Genitourinary: Negative for dysuria and flank pain.  Musculoskeletal: Negative for back pain.  Skin: Negative for rash.  Neurological: Negative for dizziness and headaches.  Hematological: Negative for adenopathy. Does not bruise/bleed easily.  Psychiatric/Behavioral: Negative for behavioral problems and confusion.    Past Medical History  Diagnosis Date  . Allergy     nasal congestion recently. Seeing allergist tomorrow.  . Asthma   . Depression   . Hemorrhoids   . Hyperthyroidism 02/28/2015     Social History   Social History  . Marital  Status: Single    Spouse Name: N/A  . Number of Children: N/A  . Years of Education: N/A   Occupational History  . Not on file.   Social History Main Topics  . Smoking status: Never Smoker   . Smokeless tobacco: Not on file  . Alcohol Use: Yes     Comment: very rare seldom.  . Drug Use: No  . Sexual Activity: Yes   Other Topics Concern  . Not on file   Social History Narrative    Past Surgical History  Procedure Laterality Date  . Abdominal hernia repair    . Toe amputation      right foot  . Hemorrhoid surgery      Family History  Problem Relation Age of Onset  . Heart disease Mother   . Heart disease Father   . Thyroid disease Neg Hx     No Known Allergies  Current Outpatient Prescriptions on File Prior to Visit  Medication Sig Dispense Refill  . Multiple Vitamins-Minerals (MULTIVITAMIN WITH MINERALS) tablet Take 1 tablet by mouth daily. Reported on 11/12/2015    . sildenafil (REVATIO) 20 MG tablet Take 1 tablet (20 mg total) by mouth 1 day or 1 dose. Take 2-4 tablets daily as needed. NO MORE THAN 4 TABLETS IN A 24 HOUR PERIOD. 50 tablet 0   No current facility-administered medications on file prior to visit.    BP 100/70 mmHg  Pulse 78  Temp(Src) 98.1 F (36.7 C) (  Oral)  Ht 5' 9.25" (1.759 m)  Wt 159 lb 4.8 oz (72.258 kg)  BMI 23.35 kg/m2  SpO2 98%       Objective:   Physical Exam   General Appearance- Not in acute distress.  HEENT Eyes- Scleraeral/Conjuntiva-bilat- Not Yellow. Mouth & Throat- Normal.  Chest and Lung Exam Auscultation: Breath sounds:-Normal. Adventitious sounds:- No Adventitious sounds.  Cardiovascular Auscultation:Rythm - Regular. Heart Sounds -Normal heart sounds.  Abdomen Inspection:-Inspection Normal.  Palpation/Perucssion: Palpation and Percussion of the abdomen reveal- Non Tender, No Rebound tenderness, No rigidity(Guarding) and No Palpable abdominal masses.  Liver:-Normal.  Spleen:- Normal.   Back- no cva  tenderness.        Assessment & Plan:  Your loose stool history may represent IBS. Will rx levbid to see if you improve but also do stool panel studies.  You have gained weight some. I would still recommend repeat a lot of labs done before on your annual physical.  Please sign release form so we can get work up from [place] [facility] for his weight loss and hx of blood in stools.   Follow up in 2-3 weeks or as needed  Penney Domanski, Percell Miller, [employer]

## 2015-11-12 NOTE — Patient Instructions (Signed)
Your loose stool history may represent IBS. Will rx levbid to see if you improve but also do stool panel studies.  You have gained weight some. I would still recommend repeat a lot of labs done before on your annual physical.  Please sign release form so we can get work up from [place] [facility] for his weight loss and hx of blood in stools.   Follow up in 2-3 weeks or as needed

## 2015-11-12 NOTE — Telephone Encounter (Signed)
Pt was seen in last year by [facility] per his report. He was sent over to work up weight loss and blood in stools. I don't think we have records from that visit. Will you call over there and have those sent to [place]. Thanks

## 2015-11-12 NOTE — Progress Notes (Signed)
Pre visit review using our clinic review tool, if applicable. No additional management support is needed unless otherwise documented below in the visit note. 

## 2015-11-13 NOTE — Telephone Encounter (Signed)
I asked him to sign release form yesterday before he left.

## 2015-11-13 NOTE — Telephone Encounter (Signed)
Spoke with [place] [facility], must have medical records release form. Is the patient aware we are trying to request records?

## 2015-11-13 NOTE — Telephone Encounter (Signed)
Release was not signed, called patient VM is full.

## 2015-11-14 LAB — CLOSTRIDIUM DIFFICILE BY PCR: CDIFFPCR: NOT DETECTED

## 2015-11-14 LAB — OVA AND PARASITE EXAMINATION: OP: NONE SEEN

## 2015-11-15 ENCOUNTER — Telehealth: Payer: Self-pay | Admitting: Medical

## 2015-11-15 ENCOUNTER — Encounter: Payer: Self-pay | Admitting: Medical

## 2015-11-15 NOTE — Telephone Encounter (Signed)
Informed the pt of his results and he did not have any further questions.

## 2015-11-15 NOTE — Telephone Encounter (Signed)
Relation to [id number]  Call back [phone]   Reason for call:  Patient returning your call regarding stool sample results.

## 2015-11-15 NOTE — Telephone Encounter (Signed)
Error:[id number] ° °

## 2015-11-17 LAB — STOOL CULTURE

## 2016-04-11 ENCOUNTER — Telehealth: Payer: Self-pay | Admitting: Medical

## 2016-04-11 NOTE — Telephone Encounter (Signed)
Caller name:  Relationship to patient: Self Can be reached: [phone]  Pharmacy:  [place],  - [address] AT [place] [phone] (Phone) [phone] (Fax)     Reason for call: Request refill on Viagra

## 2016-04-14 ENCOUNTER — Telehealth: Payer: Self-pay | Admitting: Medical

## 2016-04-14 MED ORDER — SILDENAFIL CITRATE 20 MG PO TABS: 20.0000 mg | ORAL_TABLET | ORAL | 0 refills | Status: DC

## 2016-04-14 NOTE — Telephone Encounter (Signed)
Will you refill his sildenafil rx. Same number of tabs with same sig. Then notify pt. Have him follow up in January.

## 2016-04-14 NOTE — Telephone Encounter (Signed)
The last note stated that the patient was due for a follow up with PCP and patient has been scheduled for 06/02/16 and he states that if he needs to reschedule he will call the office back.

## 2016-04-15 NOTE — Telephone Encounter (Signed)
Rx sent to the pharmacy.  Patient has a follow up on 06/02/16.

## 2016-05-13 IMAGING — DX DG CHEST 2V
2 series · 2 of 2 positions shown · non-contrast
Comparison: None.

CLINICAL DATA: Former smoker. 22 pound weight loss over the last
year.

EXAM:
CHEST  2 VIEW

[chest pa]
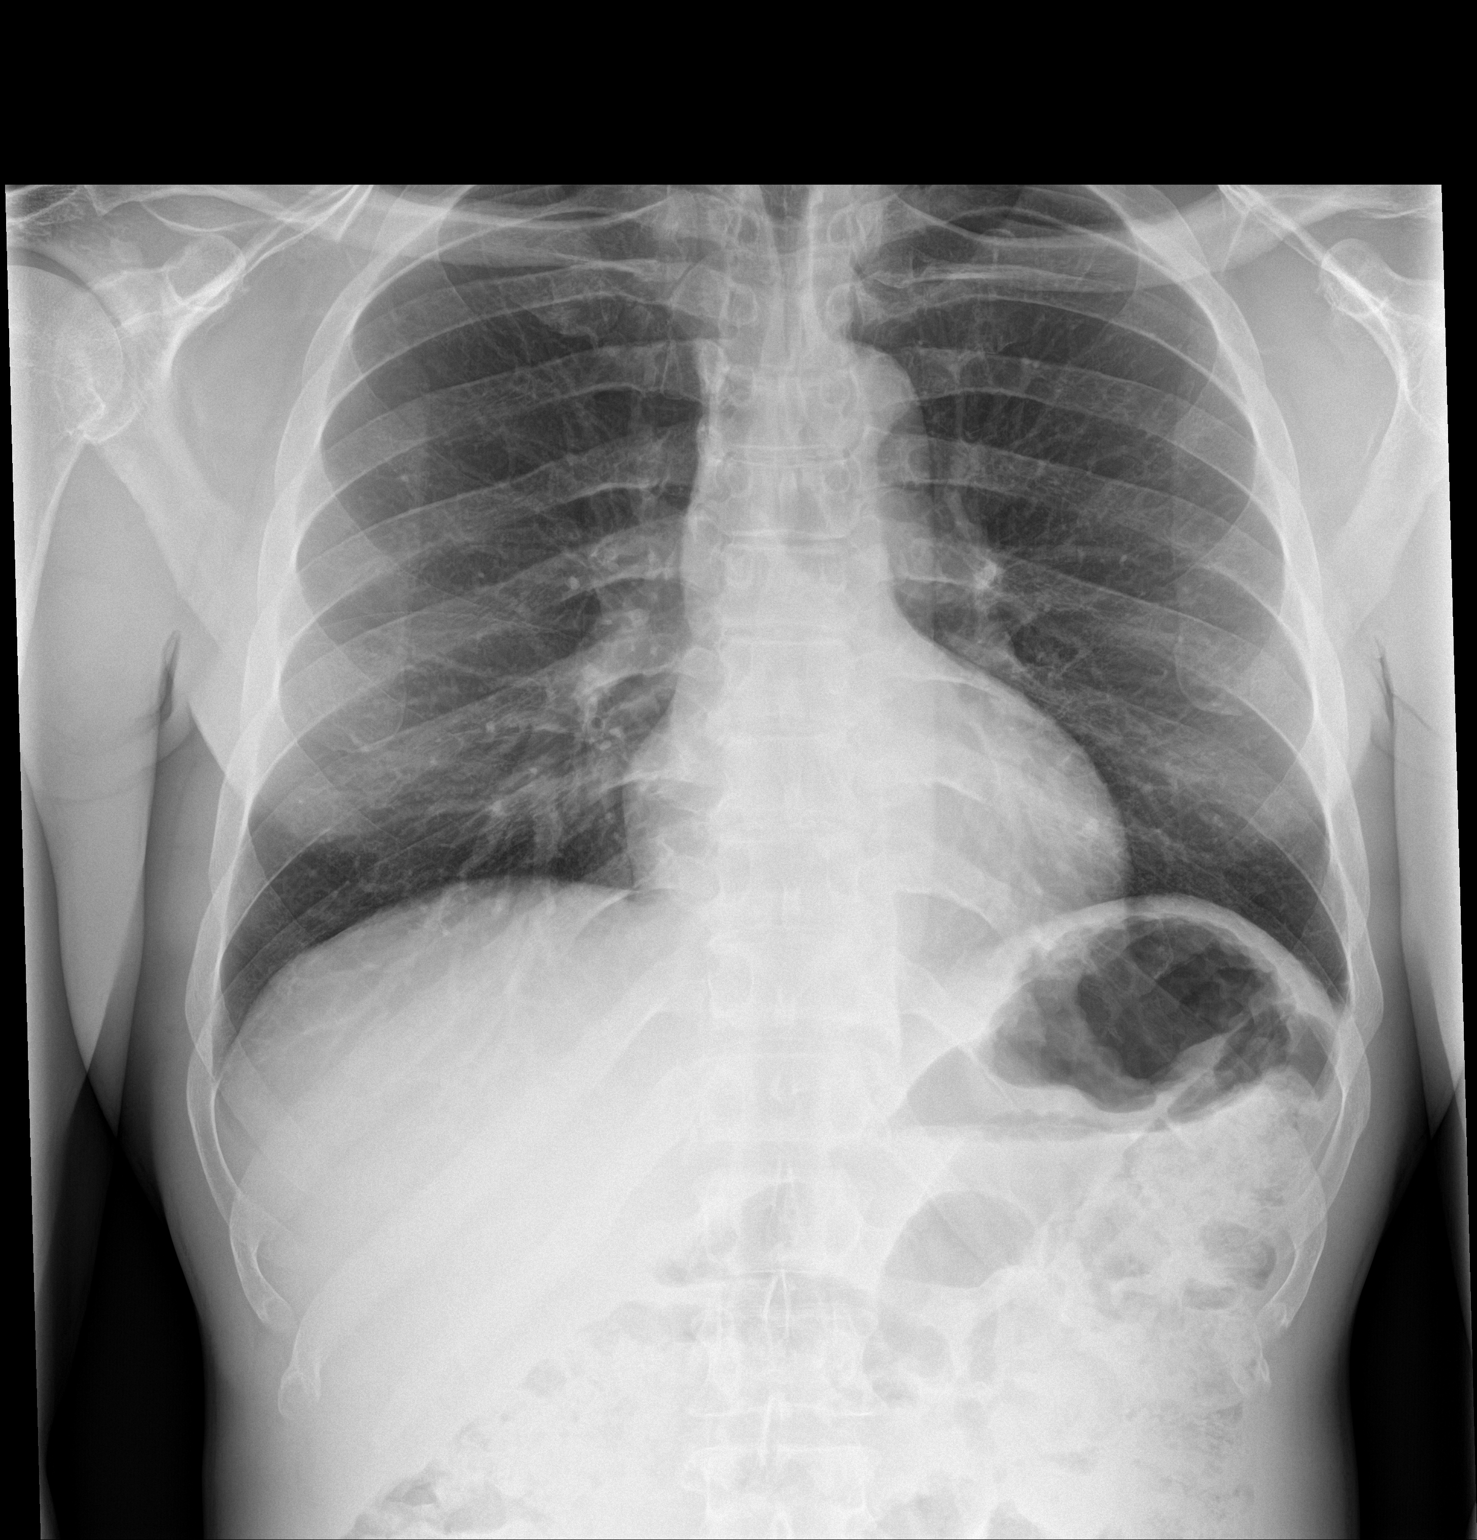

[chest lat]
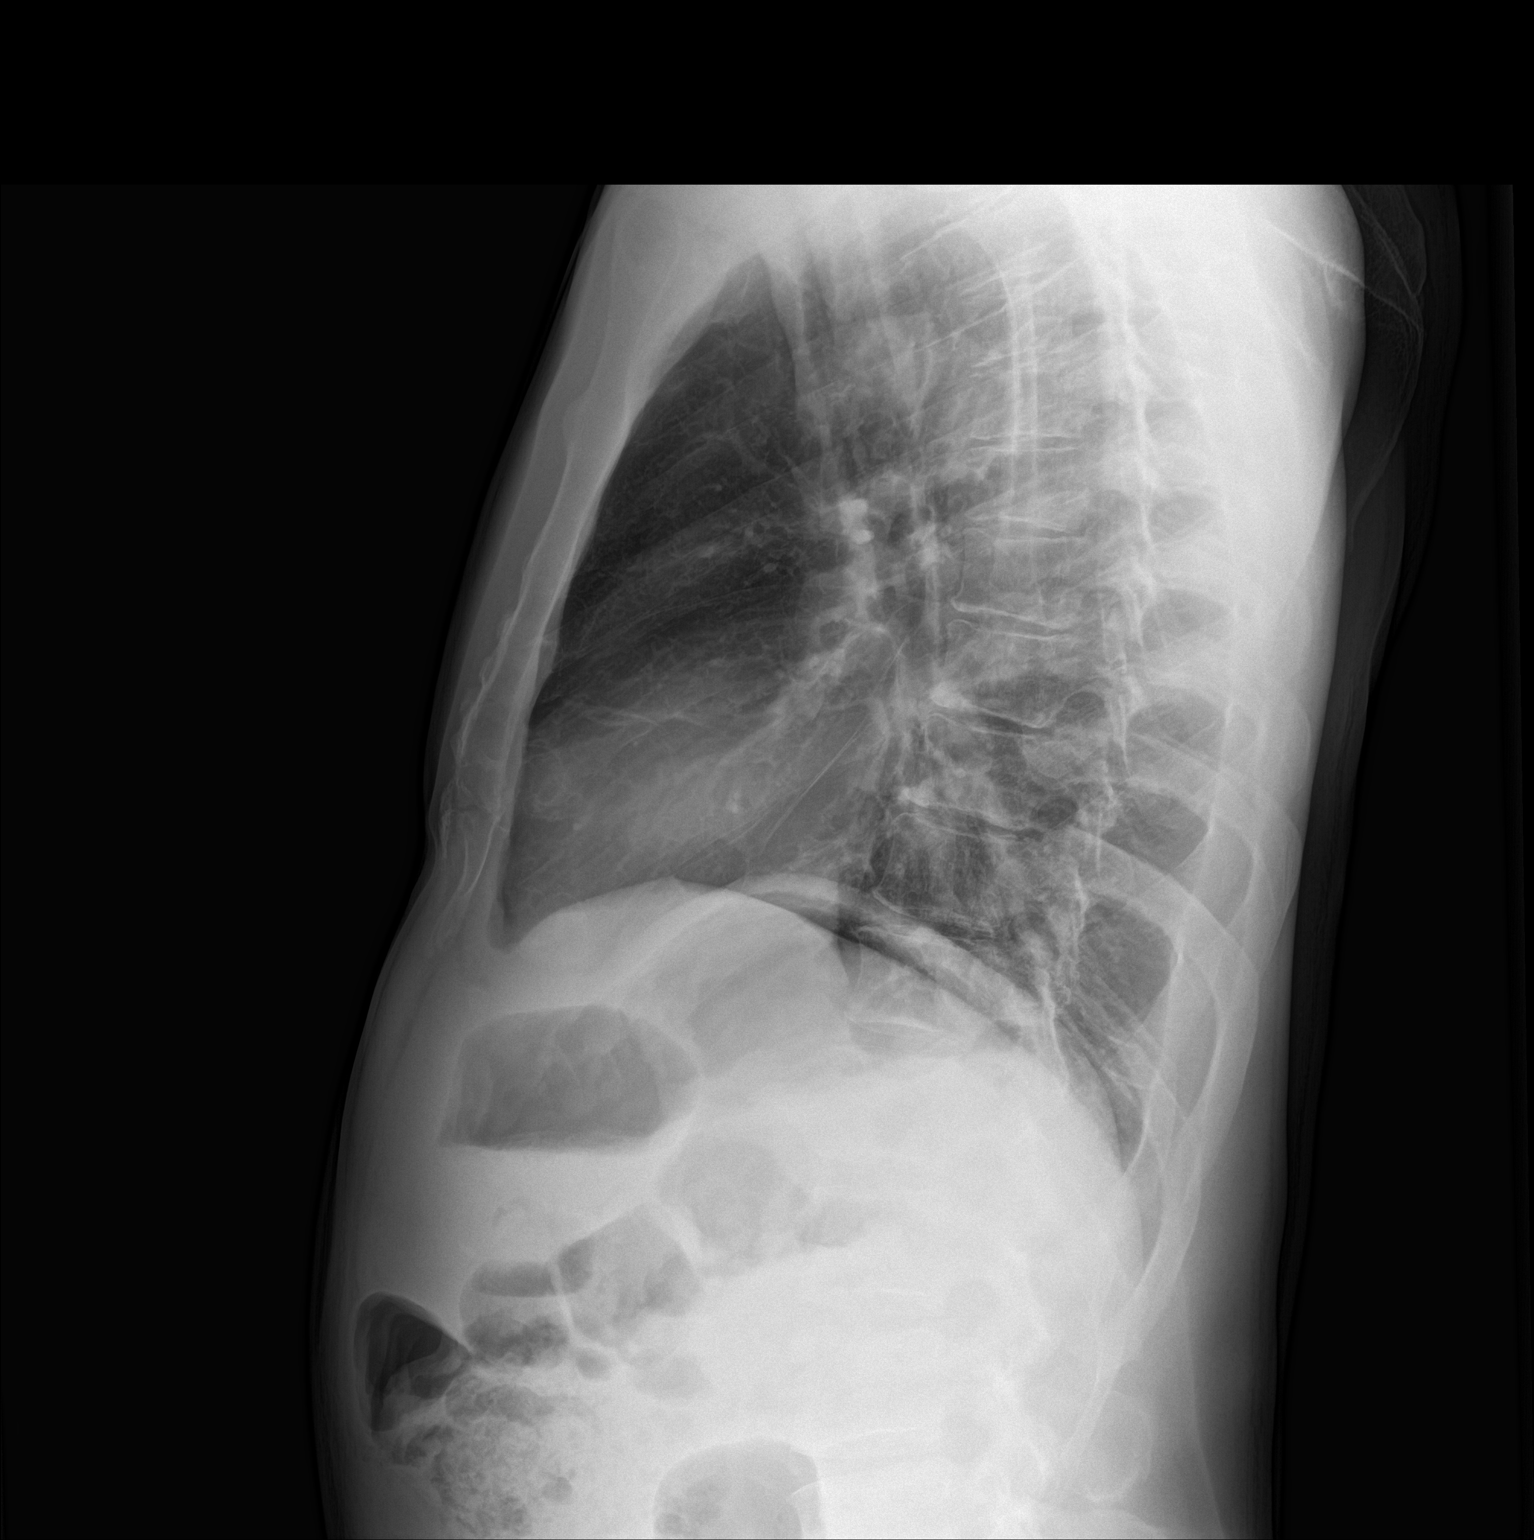

[2 of 2 positions shown; findings below may reference images not displayed]

FINDINGS: The heart size and mediastinal contours are within normal limits.
Both lungs are clear. The visualized skeletal structures are
unremarkable.
IMPRESSION: No active cardiopulmonary disease.

## 2016-05-14 ENCOUNTER — Encounter: Payer: Self-pay | Admitting: Medical

## 2016-05-14 ENCOUNTER — Ambulatory Visit (INDEPENDENT_AMBULATORY_CARE_PROVIDER_SITE_OTHER): Payer: 59 | Admitting: Medical

## 2016-05-14 VITALS — BP 140/90 | HR 72 | Temp 97.9°F | Resp 16 | Ht 69.25 in | Wt 164.0 lb

## 2016-05-14 DIAGNOSIS — J0100 Acute maxillary sinusitis, unspecified: Secondary | ICD-10-CM

## 2016-05-14 DIAGNOSIS — J209 Acute bronchitis, unspecified: Secondary | ICD-10-CM | POA: Diagnosis not present

## 2016-05-14 DIAGNOSIS — R030 Elevated blood-pressure reading, without diagnosis of hypertension: Secondary | ICD-10-CM

## 2016-05-14 DIAGNOSIS — N529 Male erectile dysfunction, unspecified: Secondary | ICD-10-CM

## 2016-05-14 DIAGNOSIS — R05 Cough: Secondary | ICD-10-CM

## 2016-05-14 DIAGNOSIS — R059 Cough, unspecified: Secondary | ICD-10-CM

## 2016-05-14 MED ORDER — FLUTICASONE PROPIONATE 50 MCG/ACT NA SUSP: 2.0000 | Freq: Every day | NASAL | 1 refills | Status: DC

## 2016-05-14 MED ORDER — SILDENAFIL CITRATE 20 MG PO TABS: 20.0000 mg | ORAL_TABLET | ORAL | 0 refills | Status: DC

## 2016-05-14 MED ORDER — DOXYCYCLINE HYCLATE 100 MG PO TABS: 100.0000 mg | ORAL_TABLET | Freq: Two times a day (BID) | ORAL | 0 refills | Status: DC

## 2016-05-14 MED ORDER — BENZONATATE 100 MG PO CAPS: 100.0000 mg | ORAL_CAPSULE | Freq: Three times a day (TID) | ORAL | 0 refills | Status: DC | PRN

## 2016-05-14 NOTE — Progress Notes (Signed)
Subjective:    Patient ID: Jon Reyes, male    DOB: [birth year], 56 y.o.   MRN: [id number]  HPI  Pt in head congestion, sinus pressure, ear pressure and some chest congestion. He his blowing out mucous from his nose and chest congestion as well. Symptoms fo 1.5 weeks.  Sometimes chills but no sweats or fever. No diffuse body aches.   Pt also wants refill of sildenafil. Too expensive at his pharmacy and they have rx. He wants to try other pharmacy. Pt has some ED.  Pt bp has been very good in past. Initial bp reading by MA bp was high. No otc decongestants but one cup coffee this am.     Review of Systems  Constitutional: Positive for chills. Negative for fatigue and fever.  HENT: Positive for congestion, postnasal drip, sinus pain and sinus pressure. Negative for rhinorrhea and sore throat.   Respiratory: Positive for cough. Negative for chest tightness, shortness of breath and wheezing.   Cardiovascular: Negative for chest pain and palpitations.  Gastrointestinal: Negative for abdominal pain.  Musculoskeletal: Negative for back pain.  Skin: Negative for rash.  Neurological: Negative for dizziness, weakness and headaches.  Hematological: Negative for adenopathy. Does not bruise/bleed easily.  Psychiatric/Behavioral: Negative for behavioral problems and confusion.    Past Medical History:  Diagnosis Date  . Allergy    nasal congestion recently. Seeing allergist tomorrow.  . Asthma   . Depression   . Hemorrhoids   . Hyperthyroidism 02/28/2015     Social History   Social History  . Marital status: Single    Spouse name: N/A  . Number of children: N/A  . Years of education: N/A   Occupational History  . Not on file.   Social History Main Topics  . Smoking status: Never Smoker  . Smokeless tobacco: Not on file  . Alcohol use Yes     Comment: very rare seldom.  . Drug use: No  . Sexual activity: Yes   Other Topics Concern  . Not on file   Social History  Narrative  . No narrative on file    Past Surgical History:  Procedure Laterality Date  . ABDOMINAL HERNIA REPAIR    . HEMORRHOID SURGERY    . TOE AMPUTATION     right foot    Family History  Problem Relation Age of Onset  . Heart disease Mother   . Heart disease Father   . Thyroid disease Neg Hx     No Known Allergies  Current Outpatient Prescriptions on File Prior to Visit  Medication Sig Dispense Refill  . sildenafil (REVATIO) 20 MG tablet Take 1 tablet (20 mg total) by mouth 1 day or 1 dose. Take 2-4 tablets daily as needed. NO MORE THAN 4 TABLETS IN A 24 HOUR PERIOD. 50 tablet 0  . Multiple Vitamins-Minerals (MULTIVITAMIN WITH MINERALS) tablet Take 1 tablet by mouth daily. Reported on 11/12/2015     No current facility-administered medications on file prior to visit.     BP (!) 139/106   Pulse 72   Temp 97.9 F (36.6 C) (Oral)   Resp 16   Ht 5' 9.25" (1.759 m)   Wt 164 lb (74.4 kg)   SpO2 (!) 72%   BMI 24.04 kg/m       Objective:   Physical Exam  General  Mental Status - Alert. General Appearance - Well groomed. Not in acute distress.  Skin Rashes- No Rashes.  HEENT Head- Normal. Ear Auditory Canal -  Left- Normal. Right - Normal.Tympanic Membrane- Left- Normal. Right- Normal. Eye Sclera/Conjunctiva- Left- Normal. Right- Normal. Nose & Sinuses Nasal Mucosa- Left-  Boggy and Congested. Right-  Boggy and  Congested.Bilateral faint  Maxillary but no frontal sinus pressure. Mouth & Throat Lips: Upper Lip- Normal: no dryness, cracking, pallor, cyanosis, or vesicular eruption. Lower Lip-Normal: no dryness, cracking, pallor, cyanosis or vesicular eruption. Buccal Mucosa- Bilateral- No Aphthous ulcers. Oropharynx- No Discharge or Erythema. Tonsils: Characteristics- Bilateral- No Erythema or Congestion. Size/Enlargement- Bilateral- No enlargement. Discharge- bilateral-None.  Neck Neck- Supple. No Masses.   Chest and Lung Exam Auscultation: Breath  Sounds:-Clear even and unlabored.  Cardiovascular Auscultation:Rythm- Regular, rate and rhythm. Murmurs & Other Heart Sounds:Ausculatation of the heart reveal- No Murmurs.  Lymphatic Head & Neck General Head & Neck Lymphatics: Bilateral: Description- No Localized lymphadenopathy.       Assessment & Plan:  You appear to have bronchitis and sinusitis(more sinus infection). Rest hydrate and tylenol for fever. I am prescribing cough medicine benzonatate, and doxycycline antibiotic. For your nasal congestion rx flonase.   You should gradually get better. If not then notify us and would recommend a chest xray.  For ED reprint generic sildenafil.   Your bp is better when I checked. No otc decongestants. Get your girlfriend to check you bp. Should be less thatn 140/90. If not then please let us know.  Also when you feel better early next week call and schedule nurse flu vaccine visit  Follow up in 7-10 days or as needed  Valree Feild, Percell Miller, [employer]

## 2016-05-14 NOTE — Patient Instructions (Addendum)
You appear to have bronchitis and sinusitis(more sinus infection). Rest hydrate and tylenol for fever. I am prescribing cough medicine benzonatate, and doxycycline antibiotic. For your nasal congestion rx flonase.   You should gradually get better. If not then notify us and would recommend a chest xray.  For ED reprint generic sildenafil.   Your bp is better when I checked. No otc decongestants. Get your girlfriend to check you bp. Should be less thatn 140/90. If not then please let us know.  Also when you feel better early next week call and schedule nurse flu vaccine visit  Follow up in 7-10 days or as needed

## 2016-05-14 NOTE — Progress Notes (Signed)
Pre visit review using our clinic review tool, if applicable. No additional management support is needed unless otherwise documented below in the visit note/SLS  

## 2016-06-02 ENCOUNTER — Ambulatory Visit: Payer: 59 | Admitting: Medical

## 2016-06-02 ENCOUNTER — Ambulatory Visit (INDEPENDENT_AMBULATORY_CARE_PROVIDER_SITE_OTHER): Payer: 59

## 2016-06-02 ENCOUNTER — Telehealth: Payer: Self-pay | Admitting: *Deleted

## 2016-06-02 DIAGNOSIS — Z23 Encounter for immunization: Secondary | ICD-10-CM | POA: Diagnosis not present

## 2016-06-02 NOTE — Telephone Encounter (Signed)
Patient informed, understood & agreed/SLS 01/29

## 2016-06-02 NOTE — Telephone Encounter (Signed)
Patient came in to office and dropped off handwritten note with the following BP readings: Fri, 05/16/16 - 120/86, 110/82 at 9:25pm Sat, 05/17/16 - 120/82 at 8:30pm Sun, 05/18/16 - 120/80 Mon, 05/18/16 - 120/80 Tues, 05/19/16 - 110/88 Wed, 05/20/16 - 110/78 Thurs, 05/21/16 - 120/82 Fri, 05/22/16 - 120/80 Mon, 05/25/16 - 108/72 Fri, 05/29/16 - 120/80

## 2016-06-02 NOTE — Telephone Encounter (Signed)
His bp readings are a lot better than other day. Have him check couple of couple of time every 2 weeks to verify staying in good range. Check as well if any ha, light headed, dizziness or fatigue events. Otherwise happy with those numbers. Thank him for checking as asked.

## 2016-06-02 NOTE — Progress Notes (Signed)
Pre visit review using our clinic tool,if applicable. No additional management support is needed unless otherwise documented below in the visit note.  

## 2016-09-26 ENCOUNTER — Encounter: Payer: Self-pay | Admitting: Medical

## 2016-09-26 ENCOUNTER — Ambulatory Visit (HOSPITAL_BASED_OUTPATIENT_CLINIC_OR_DEPARTMENT_OTHER)
Admission: RE | Admit: 2016-09-26 | Discharge: 2016-09-26 | Disposition: A | Discharge status: Home / Self Care | Payer: 59 | Source: Ambulatory Visit | Attending: Medical | Admitting: Medical

## 2016-09-26 ENCOUNTER — Ambulatory Visit (INDEPENDENT_AMBULATORY_CARE_PROVIDER_SITE_OTHER): Payer: 59 | Admitting: Medical

## 2016-09-26 VITALS — BP 135/86 | HR 92 | Temp 98.1°F | Ht 69.25 in | Wt 158.6 lb

## 2016-09-26 DIAGNOSIS — M25552 Pain in left hip: Secondary | ICD-10-CM

## 2016-09-26 DIAGNOSIS — M5136 Other intervertebral disc degeneration, lumbar region: Secondary | ICD-10-CM | POA: Insufficient documentation

## 2016-09-26 DIAGNOSIS — M545 Low back pain, unspecified: Secondary | ICD-10-CM

## 2016-09-26 DIAGNOSIS — M47816 Spondylosis without myelopathy or radiculopathy, lumbar region: Secondary | ICD-10-CM | POA: Diagnosis not present

## 2016-09-26 MED ORDER — SILDENAFIL CITRATE 20 MG PO TABS: 20.0000 mg | ORAL_TABLET | ORAL | 0 refills | Status: DC

## 2016-09-26 MED ORDER — DICLOFENAC SODIUM 75 MG PO TBEC: 75.0000 mg | DELAYED_RELEASE_TABLET | Freq: Two times a day (BID) | ORAL | 0 refills | Status: DC

## 2016-09-26 NOTE — Progress Notes (Signed)
Subjective:    Patient ID: Alper Guilmette, male    DOB: 1961-02-06, 56 y.o.   MRN: [id number]  HPI  Pt in states he had lower back pain for about one week. No trauma of fall. Pt states pain was on side first but now in middle lower back. Pt states pain worse with moving. Pt states intermittent pain in the past. He states on and off for 10 years.   Pain 3/10.  No pain shooting to legs. No saddle anesthesia. No incontinence.  Pt states yesterday morning tried otc ibuprofen. Helped little briefly.  In past he had some mild bp elevation. His girlfriend had been checking and she was checking daily. He states she was getting 120/70 readings.  Some mild left hip pain occasionally.   Review of Systems  Constitutional: Negative for chills, fatigue and fever.  Respiratory: Negative for cough, chest tightness, shortness of breath and wheezing.   Cardiovascular: Negative for chest pain and palpitations.  Gastrointestinal: Negative for abdominal distention, abdominal pain, anal bleeding, constipation, diarrhea and vomiting.  Musculoskeletal: Positive for back pain.       Rt hip pain.  Skin: Negative for pallor and rash.  Neurological: Negative for dizziness and headaches.  Hematological: Negative for adenopathy. Does not bruise/bleed easily.  Psychiatric/Behavioral: Negative for behavioral problems and confusion. Dysphoric mood:      Past Medical History:  Diagnosis Date  . Allergy    nasal congestion recently. Seeing allergist tomorrow.  . Asthma   . Depression   . Hemorrhoids   . Hyperthyroidism 02/28/2015     Social History   Social History  . Marital status: Single    Spouse name: N/A  . Number of children: N/A  . Years of education: N/A   Occupational History  . Not on file.   Social History Main Topics  . Smoking status: Never Smoker  . Smokeless tobacco: Never Used  . Alcohol use Yes     Comment: very rare seldom.  . Drug use: No  . Sexual activity: Yes    Other Topics Concern  . Not on file   Social History Narrative  . No narrative on file    Past Surgical History:  Procedure Laterality Date  . ABDOMINAL HERNIA REPAIR    . HEMORRHOID SURGERY    . TOE AMPUTATION     right foot    Family History  Problem Relation Age of Onset  . Heart disease Mother   . Heart disease Father   . Thyroid disease Neg Hx     No Known Allergies  Current Outpatient Prescriptions on File Prior to Visit  Medication Sig Dispense Refill  . Multiple Vitamins-Minerals (MULTIVITAMIN WITH MINERALS) tablet Take 1 tablet by mouth daily. Reported on 11/12/2015    . sildenafil (REVATIO) 20 MG tablet Take 1 tablet (20 mg total) by mouth 1 day or 1 dose. Take 2-4 tablets daily as needed. NO MORE THAN 4 TABLETS IN A 24 HOUR PERIOD. 50 tablet 0   No current facility-administered medications on file prior to visit.     BP 135/86 (BP Location: Left Arm, Patient Position: Sitting, Cuff Size: Normal)   Pulse 92   Temp 98.1 F (36.7 C) (Oral)   Ht 5' 9.25" (1.759 m)   Wt 158 lb 9.6 oz (71.9 kg)   SpO2 100%   BMI 23.25 kg/m       Objective:   Physical Exam  General Appearance- Not in acute distress.    Chest  and Lung Exam Auscultation: Breath sounds:-Normal. Clear even and unlabored. Adventitious sounds:- No Adventitious sounds.  Cardiovascular Auscultation:Rythm - Regular, rate and rythm. Heart Sounds -Normal heart sounds.  Abdomen Inspection:-Inspection Normal.  Palpation/Perucssion: Palpation and Percussion of the abdomen reveal- Non Tender, No Rebound tenderness, No rigidity(Guarding) and No Palpable abdominal masses.  Liver:-Normal.  Spleen:- Normal.   Back Mid lumbar spine tenderness to palpation. No pain on straight leg lift. Mild pain on lateral movements and flexion/extension of the spine.  Lt hip- faint pain on palpation on rom and palpation.  Lower ext neurologic  L5-S1 sensation intact bilaterally. Normal patellar reflexes  bilaterally. No foot drop bilaterally.      Assessment & Plan:  For your low back pain and hip pain will get xrays.  For pain will rx  Diclofenac. No otc nsaids.  Do back stretching exercises as tolerated.  If pain worsens/red flag symptoms as discussed then ED evaluation. Otherwise follow xrays and see if pain tapers off. If not then consider sports medicine or PT referral.  Follow up in 7-10 days or as needed  For your Erectile dysfunction I did refill your generic viagra.  Arcenia Scarbro, Percell Miller, PA-C

## 2016-09-26 NOTE — Patient Instructions (Addendum)
For your low back pain and hip pain will get xrays.  For pain will rx  Diclofenac. No otc nsaids.  Do back stretching exercises as tolerated.  If pain worsens/red flag symptoms as discussed then ED evaluation. Otherwise follow xrays and see if pain tapers off. If not then consider sports medicine or PT referral.  Follow up in 7-10 days or as needed   Back Exercises If you have pain in your back, do these exercises 2-3 times each day or as told by your doctor. When the pain goes away, do the exercises once each day, but repeat the steps more times for each exercise (do more repetitions). If you do not have pain in your back, do these exercises once each day or as told by your doctor. Exercises Single Knee to Chest   Do these steps 3-5 times in a row for each leg: 1. Lie on your back on a firm bed or the floor with your legs stretched out. 2. Bring one knee to your chest. 3. Hold your knee to your chest by grabbing your knee or thigh. 4. Pull on your knee until you feel a gentle stretch in your lower back. 5. Keep doing the stretch for 10-30 seconds. 6. Slowly let go of your leg and straighten it. Pelvic Tilt   Do these steps 5-10 times in a row: 1. Lie on your back on a firm bed or the floor with your legs stretched out. 2. Bend your knees so they point up to the ceiling. Your feet should be flat on the floor. 3. Tighten your lower belly (abdomen) muscles to press your lower back against the floor. This will make your tailbone point up to the ceiling instead of pointing down to your feet or the floor. 4. Stay in this position for 5-10 seconds while you gently tighten your muscles and breathe evenly. Cat-Cow   Do these steps until your lower back bends more easily: 1. Get on your hands and knees on a firm surface. Keep your hands under your shoulders, and keep your knees under your hips. You may put padding under your knees. 2. Let your head hang down, and make your tailbone point  down to the floor so your lower back is round like the back of a cat. 3. Stay in this position for 5 seconds. 4. Slowly lift your head and make your tailbone point up to the ceiling so your back hangs low (sags) like the back of a cow. 5. Stay in this position for 5 seconds. Press-Ups   Do these steps 5-10 times in a row: 1. Lie on your belly (face-down) on the floor. 2. Place your hands near your head, about shoulder-width apart. 3. While you keep your back relaxed and keep your hips on the floor, slowly straighten your arms to raise the top half of your body and lift your shoulders. Do not use your back muscles. To make yourself more comfortable, you may change where you place your hands. 4. Stay in this position for 5 seconds. 5. Slowly return to lying flat on the floor. Bridges   Do these steps 10 times in a row: 1. Lie on your back on a firm surface. 2. Bend your knees so they point up to the ceiling. Your feet should be flat on the floor. 3. Tighten your butt muscles and lift your butt off of the floor until your waist is almost as high as your knees. If you do not feel the  muscles working in your butt and the back of your thighs, slide your feet 1-2 inches farther away from your butt. 4. Stay in this position for 3-5 seconds. 5. Slowly lower your butt to the floor, and let your butt muscles relax. If this exercise is too easy, try doing it with your arms crossed over your chest. Belly Crunches   Do these steps 5-10 times in a row: 1. Lie on your back on a firm bed or the floor with your legs stretched out. 2. Bend your knees so they point up to the ceiling. Your feet should be flat on the floor. 3. Cross your arms over your chest. 4. Tip your chin a little bit toward your chest but do not bend your neck. 5. Tighten your belly muscles and slowly raise your chest just enough to lift your shoulder blades a tiny bit off of the floor. 6. Slowly lower your chest and your head to the  floor. Back Lifts  Do these steps 5-10 times in a row: 1. Lie on your belly (face-down) with your arms at your sides, and rest your forehead on the floor. 2. Tighten the muscles in your legs and your butt. 3. Slowly lift your chest off of the floor while you keep your hips on the floor. Keep the back of your head in line with the curve in your back. Look at the floor while you do this. 4. Stay in this position for 3-5 seconds. 5. Slowly lower your chest and your face to the floor. Contact a doctor if:  Your back pain gets a lot worse when you do an exercise.  Your back pain does not lessen 2 hours after you exercise. If you have any of these problems, stop doing the exercises. Do not do them again unless your doctor says it is okay. Get help right away if:  You have sudden, very bad back pain. If this happens, stop doing the exercises. Do not do them again unless your doctor says it is okay. This information is not intended to replace advice given to you by your health care provider. Make sure you discuss any questions you have with your health care provider. Document Released: 05/24/2010 Document Revised: 09/27/2015 Document Reviewed: 06/15/2014 Elsevier Interactive Patient Education  2017 [employer].

## 2016-09-30 ENCOUNTER — Telehealth: Payer: Self-pay | Admitting: Medical

## 2016-09-30 NOTE — Telephone Encounter (Signed)
Patient returning call back [phone] (M)

## 2016-10-01 NOTE — Telephone Encounter (Signed)
Patient is calling back again, request call back

## 2016-10-03 NOTE — Telephone Encounter (Signed)
Spoke with pt. Notified pt of lab results.

## 2017-06-15 ENCOUNTER — Telehealth: Payer: Self-pay | Admitting: Medical

## 2017-06-15 NOTE — Telephone Encounter (Signed)
Copied from [address]. Topic: Quick Communication - Rx Refill/Question >> Jun 15, 2017  3:29 PM Synthia Innocent wrote: Medication: sildenafil (REVATIO) 20 MG tablet    Has the patient contacted their pharmacy? Yes.     (Agent: If no, request that the patient contact the pharmacy for the refill.)   Preferred Pharmacy (with phone number or street name): [facility]: Please be advised that RX refills may take up to 3 business days. We ask that you follow-up with your pharmacy. Lost script during recent move. Please advise

## 2017-06-15 NOTE — Telephone Encounter (Signed)
Revatio 20 mg refill Last OV: 05/14/16; no upcoming appointment Last Refill:05/14/16 paper prescription, patient says lost script during recent move Pharmacy:[facility]

## 2017-06-16 MED ORDER — SILDENAFIL CITRATE 20 MG PO TABS: 20.0000 mg | ORAL_TABLET | ORAL | 0 refills | Status: DC

## 2017-06-16 NOTE — Telephone Encounter (Signed)
Refilled patient's generic Viagra.  20 mg tablets.  He states that he lost the prescription during the recent move.  I sent the patient electronically and I might have accidentally printed it the prescription before I sent it electronically.  So would you check the printer and if I successfully sent it electronically then shread  the printed prescription.

## 2017-07-02 ENCOUNTER — Encounter: Payer: 59 | Admitting: Medical

## 2017-07-02 ENCOUNTER — Encounter: Payer: Self-pay | Admitting: Medical

## 2017-07-10 ENCOUNTER — Encounter: Payer: Self-pay | Admitting: Medical

## 2017-07-10 ENCOUNTER — Ambulatory Visit: Payer: 59 | Admitting: Medical

## 2017-07-10 VITALS — BP 130/80 | HR 81 | Temp 98.1°F | Resp 16 | Ht 69.0 in | Wt 165.2 lb

## 2017-07-10 DIAGNOSIS — R059 Cough, unspecified: Secondary | ICD-10-CM

## 2017-07-10 DIAGNOSIS — J111 Influenza due to unidentified influenza virus with other respiratory manifestations: Secondary | ICD-10-CM | POA: Diagnosis not present

## 2017-07-10 DIAGNOSIS — R05 Cough: Secondary | ICD-10-CM

## 2017-07-10 DIAGNOSIS — R110 Nausea: Secondary | ICD-10-CM | POA: Diagnosis not present

## 2017-07-10 DIAGNOSIS — R195 Other fecal abnormalities: Secondary | ICD-10-CM

## 2017-07-10 LAB — POC INFLUENZA A&B (BINAX/QUICKVUE)
INFLUENZA B, POC: NEGATIVE
Influenza A, POC: POSITIVE — AB

## 2017-07-10 MED ORDER — BENZONATATE 100 MG PO CAPS: 100.0000 mg | ORAL_CAPSULE | Freq: Three times a day (TID) | ORAL | 0 refills | Status: DC | PRN

## 2017-07-10 MED ORDER — OSELTAMIVIR PHOSPHATE 75 MG PO CAPS: 75.0000 mg | ORAL_CAPSULE | Freq: Two times a day (BID) | ORAL | 0 refills | Status: DC

## 2017-07-10 MED ORDER — ONDANSETRON 8 MG PO TBDP: 8.0000 mg | ORAL_TABLET | Freq: Three times a day (TID) | ORAL | 0 refills | Status: DC | PRN

## 2017-07-10 NOTE — Progress Notes (Signed)
Subjective:    Patient ID: Jon Reyes, male    DOB: December 29, 1960, 57 y.o.   MRN: [id number]  HPI  Pt in for evaluation.  Pt states his grandson has been sick. He was watching grandson who had some GI symptoms.Pt son in law also went to ED.Son in law has same signs and symptoms.  Pt states last 2 days runny nose and some cough. No sinus pain. Mild left ear pain.  Pt states this morning nausea smelling [place]. He had 3-4 loose stools yesterday. Today already had 4 loose stools. He has not vomited. Some chills. No bodyaches. No recent antibiotics. Decreased appetite and he is fatigued.    Review of Systems  Constitutional: Positive for chills, diaphoresis and fatigue. Negative for fever.  HENT: Negative for congestion, ear pain, mouth sores, postnasal drip, sinus pressure and sinus pain.   Respiratory: Positive for cough. Negative for chest tightness, shortness of breath and wheezing.   Cardiovascular: Negative for chest pain and palpitations.  Gastrointestinal: Positive for diarrhea and nausea. Negative for abdominal distention, abdominal pain, blood in stool, constipation, rectal pain and vomiting.  Genitourinary: Negative for decreased urine volume, dysuria, flank pain, frequency, genital sores, scrotal swelling and testicular pain.  Musculoskeletal: Negative for back pain, joint swelling, neck pain and neck stiffness.  Skin: Negative for rash.  Neurological: Negative for dizziness, speech difficulty, weakness, light-headedness and headaches.  Hematological: Negative for adenopathy. Does not bruise/bleed easily.  Psychiatric/Behavioral: Negative for behavioral problems, confusion and sleep disturbance. The patient is not nervous/anxious.     Past Medical History:  Diagnosis Date  . Allergy    nasal congestion recently. Seeing allergist tomorrow.  . Asthma   . Depression   . Hemorrhoids   . Hyperthyroidism 02/28/2015     Social History   Socioeconomic History  .  Marital status: Single    Spouse name: Not on file  . Number of children: Not on file  . Years of education: Not on file  . Highest education level: Not on file  Social Needs  . Financial resource strain: Not on file  . Food insecurity - worry: Not on file  . Food insecurity - inability: Not on file  . Transportation needs - medical: Not on file  . Transportation needs - non-medical: Not on file  Occupational History  . Not on file  Tobacco Use  . Smoking status: Never Smoker  . Smokeless tobacco: Never Used  Substance and Sexual Activity  . Alcohol use: Yes    Comment: very rare seldom.  . Drug use: No  . Sexual activity: Yes  Other Topics Concern  . Not on file  Social History Narrative  . Not on file    Past Surgical History:  Procedure Laterality Date  . ABDOMINAL HERNIA REPAIR    . HEMORRHOID SURGERY    . TOE AMPUTATION     right foot    Family History  Problem Relation Age of Onset  . Heart disease Mother   . Heart disease Father   . Thyroid disease Neg Hx     No Known Allergies  Current Outpatient Medications on File Prior to Visit  Medication Sig Dispense Refill  . hyoscyamine (LEVBID) 0.375 MG 12 hr tablet Take by mouth.    . Multiple Vitamins-Minerals (MULTIVITAMIN WITH MINERALS) tablet Take 1 tablet by mouth daily. Reported on 11/12/2015    . sildenafil (REVATIO) 20 MG tablet Take 1 tablet (20 mg total) by mouth 1 day or 1  dose. Take 2-4 tablets daily as needed. NO MORE THAN 4 TABLETS IN A 24 HOUR PERIOD. 50 tablet 0   No current facility-administered medications on file prior to visit.     BP (!) 144/90   Pulse 81   Temp 98.1 F (36.7 C) (Oral)   Resp 16   Ht _0  (1.753 m)   Wt 165 lb 3.2 oz (74.9 kg)   SpO2 100%   BMI 24.40 kg/m       Objective:   Physical Exam  General  Mental Status - Alert. General Appearance - Well groomed. Not in acute distress.  Skin Rashes- No Rashes.  HEENT Head- Normal. Ear Auditory Canal - Left-  Normal. Right - Normal.Tympanic Membrane- Left- Normal. Right- Normal. Eye Sclera/Conjunctiva- Left- Normal. Right- Normal. Nose & Sinuses Nasal Mucosa- Left-  Boggy and Congested. Right-  Boggy and  Congested.Bilateral no maxillary and no  frontal sinus pressure. Mouth & Throat Lips: Upper Lip- Normal: no dryness, cracking, pallor, cyanosis, or vesicular eruption. Lower Lip-Normal: no dryness, cracking, pallor, cyanosis or vesicular eruption. Buccal Mucosa- Bilateral- No Aphthous ulcers. Oropharynx- No Discharge or Erythema. Tonsils: Characteristics- Bilateral- No Erythema or Congestion. Size/Enlargement- Bilateral- No enlargement. Discharge- bilateral-None.  Neck Neck- Supple. No Masses.   Chest and Lung Exam Auscultation: Breath Sounds:-Clear even and unlabored.  Cardiovascular Auscultation:Rythm- Regular, rate and rhythm. Murmurs & Other Heart Sounds:Ausculatation of the heart reveal- No Murmurs.  Lymphatic Head & Neck General Head & Neck Lymphatics: Bilateral: Description- No Localized lymphadenopathy.  Abdomen-soft, nontender, nondistended, + bowel sounds. No rebound or guarding and no organomegaly.       Assessment & Plan:  Your flu test came back positive.  I am prescribing Tamiflu.  Please start that today.  For recent cough, I am making benzonatate available.  For nausea or vomiting, I am prescribing Zofran.  If you develop any body aches or fever they can use ibuprofen or Tylenol.  For your recent loose stools, I want you to make sure you stay well-hydrated.  Recommend propel fitness water and start eating bland diet.  If your loose stools worsen over the weekend then notify me on Monday and would have you pick up stool panel kit to workup causes of diarrhea.  Also can use Imodium AD for diarrhea over the weekend.  Follow-up in 7-10 days or as needed.  Mackie Pai, PA-C

## 2017-07-10 NOTE — Patient Instructions (Addendum)
Your flu test came back positive.  I am prescribing Tamiflu.  Please start that today.  For recent cough, I am making benzonatate available.  For nausea or vomiting, I am prescribing Zofran.  If you develop any body aches or fever they can use ibuprofen or Tylenol.  For your recent loose stools, I want you to make sure you stay well-hydrated.  Recommend propel fitness water and start eating bland diet.  If your loose stools worsen over the weekend then notify me on Monday and would have you pick up stool panel kit to workup causes of diarrhea.  Also can use Imodium AD for diarrhea over the weekend.    Follow-up in 7-10 days or as needed.

## 2017-07-10 NOTE — Progress Notes (Signed)
po

## 2017-07-14 ENCOUNTER — Encounter: Payer: Self-pay | Admitting: Medical

## 2017-07-15 ENCOUNTER — Ambulatory Visit (HOSPITAL_BASED_OUTPATIENT_CLINIC_OR_DEPARTMENT_OTHER)
Admission: RE | Admit: 2017-07-15 | Discharge: 2017-07-15 | Disposition: A | Discharge status: Home / Self Care | Payer: 59 | Source: Ambulatory Visit | Attending: Medical | Admitting: Medical

## 2017-07-15 ENCOUNTER — Ambulatory Visit (INDEPENDENT_AMBULATORY_CARE_PROVIDER_SITE_OTHER): Payer: 59 | Admitting: Medical

## 2017-07-15 ENCOUNTER — Encounter: Payer: Self-pay | Admitting: Medical

## 2017-07-15 VITALS — BP 143/87 | HR 68 | Resp 16 | Ht 69.0 in | Wt 166.0 lb

## 2017-07-15 DIAGNOSIS — Z125 Encounter for screening for malignant neoplasm of prostate: Secondary | ICD-10-CM | POA: Diagnosis not present

## 2017-07-15 DIAGNOSIS — H6123 Impacted cerumen, bilateral: Secondary | ICD-10-CM | POA: Diagnosis not present

## 2017-07-15 DIAGNOSIS — Z113 Encounter for screening for infections with a predominantly sexual mode of transmission: Secondary | ICD-10-CM

## 2017-07-15 DIAGNOSIS — Z0000 Encounter for general adult medical examination without abnormal findings: Secondary | ICD-10-CM | POA: Diagnosis not present

## 2017-07-15 DIAGNOSIS — Z87891 Personal history of nicotine dependence: Secondary | ICD-10-CM | POA: Diagnosis not present

## 2017-07-15 DIAGNOSIS — K921 Melena: Secondary | ICD-10-CM

## 2017-07-15 DIAGNOSIS — R5383 Other fatigue: Secondary | ICD-10-CM | POA: Diagnosis not present

## 2017-07-15 DIAGNOSIS — Z1211 Encounter for screening for malignant neoplasm of colon: Secondary | ICD-10-CM

## 2017-07-15 MED ORDER — DOXYCYCLINE HYCLATE 100 MG PO TABS: 100.0000 mg | ORAL_TABLET | Freq: Two times a day (BID) | ORAL | 0 refills | Status: DC

## 2017-07-15 NOTE — Patient Instructions (Addendum)
For you wellness exam today I have ordered cbc, cmp, tsh, lipid panel, psa, ua and hiv.  Vaccine appear up to date. Pt at  tail end of the flu. Improved but energy still lagging.  Recommend exercise and healthy diet.  We will let you know lab results as they come in.  Follow up date appointment will be determined after lab review.   For history of intermittent bright red blood when wiping after bowel movement and some occasional blood in toilet water, I am referring you to gastroenterologist.  Your stool card test was negative for blood today in the office but I still want you to do ifob at home and return that.  This may help direct GI workup.  For history of smoking, I want to get chest x-ray.  For recent small abscess and perineal region with subsequent drainage, I am prescribing doxycycline for 7 days.  For cerumen impaction, i recommend using Debrox over-the-counter.  After wax is softened and moist you could try to clean lightly with Q-tip.  You may be successful in removing wax but if wax becomes more impacted then would need lavage.

## 2017-07-15 NOTE — Progress Notes (Signed)
Subjective:    Patient ID: Jon Reyes, male    DOB: 1961/04/18, 57 y.o.   MRN: [id number]  HPI   Pt in for CPE.  He is fasting.  Pt up to date on his tetanus vaccine.  Pt mentioned mild perineum swelling and discomfort recently with some subsequent yellow discharge. See ros below.  Pt had colonoscopy done in 2016. No polyps seen.  Pt used to smoke cigars years back about 10 years or more. None since. Prior chest xray was normal in 2016.   Pt weight has increased recently over past 2 years. Approximate 17 lb weight gain after period of weight  Loss.     Review of Systems  Constitutional: Positive for fatigue. Negative for chills and unexpected weight change.       Still fatigued post flu but energy level is improving.  HENT: Negative for congestion, drooling and ear pain.   Respiratory: Negative for apnea, cough, choking, shortness of breath and wheezing.   Cardiovascular: Negative for chest pain and palpitations.  Gastrointestinal: Negative for abdominal distention, anal bleeding, blood in stool, constipation, nausea and vomiting.       History of bright red blood sometimes when he wipes. Has history of hemorrhoids in past.  States surgery was year and half ago for hemorhoid.  He does report off and on blood on toilet paper every 2 weeks. Rare sees blood in toilet paper.  Genitourinary: Negative for decreased urine volume, difficulty urinating, discharge, enuresis, flank pain, frequency, hematuria, penile pain and penile swelling.  Musculoskeletal: Negative for back pain and myalgias.  Skin: Negative for rash.       Left side pernieum area swollen and tender. Then burst and darainged. Residual tender  Neurological: Negative for dizziness and headaches.  Hematological: Negative for adenopathy. Does not bruise/bleed easily.  Psychiatric/Behavioral: Negative for behavioral problems, confusion and self-injury. The patient is not nervous/anxious.     Past Medical  History:  Diagnosis Date  . Allergy    nasal congestion recently. Seeing allergist tomorrow.  . Asthma   . Depression   . Hemorrhoids   . Hyperthyroidism 02/28/2015     Social History   Socioeconomic History  . Marital status: Single    Spouse name: Not on file  . Number of children: Not on file  . Years of education: Not on file  . Highest education level: Not on file  Social Needs  . Financial resource strain: Not on file  . Food insecurity - worry: Not on file  . Food insecurity - inability: Not on file  . Transportation needs - medical: Not on file  . Transportation needs - non-medical: Not on file  Occupational History  . Not on file  Tobacco Use  . Smoking status: Never Smoker  . Smokeless tobacco: Never Used  Substance and Sexual Activity  . Alcohol use: Yes    Comment: very rare seldom.  . Drug use: No  . Sexual activity: Yes  Other Topics Concern  . Not on file  Social History Narrative  . Not on file    Past Surgical History:  Procedure Laterality Date  . ABDOMINAL HERNIA REPAIR    . HEMORRHOID SURGERY    . TOE AMPUTATION     right foot    Family History  Problem Relation Age of Onset  . Heart disease Mother   . Heart disease Father   . Thyroid disease Neg Hx     No Known Allergies  Current Outpatient Medications  on File Prior to Visit  Medication Sig Dispense Refill  . Multiple Vitamins-Minerals (MULTIVITAMIN WITH MINERALS) tablet Take 1 tablet by mouth daily. Reported on 11/12/2015    . sildenafil (REVATIO) 20 MG tablet Take 1 tablet (20 mg total) by mouth 1 day or 1 dose. Take 2-4 tablets daily as needed. NO MORE THAN 4 TABLETS IN A 24 HOUR PERIOD. 50 tablet 0   No current facility-administered medications on file prior to visit.     BP (!) 143/87 (BP Location: Right Arm, Patient Position: Sitting, Cuff Size: Normal)   Pulse 68   Resp 16   Ht 5\' 9"  (1.753 m)   Wt 166 lb (75.3 kg)   SpO2 100%   BMI 24.51 kg/m       Objective:    Physical Exam  General Mental Status- Alert. General Appearance- Not in acute distress.    Heent- normal but both canals obstructed with wax. Left side worse.   Skin General: Color- Normal Color. Moisture- Normal Moisture.  Neck Carotid Arteries- Normal color. Moisture- Normal Moisture. No carotid bruits. No JVD.  Chest and Lung Exam Auscultation: Breath Sounds:-Normal.  Cardiovascular Auscultation:Rythm- Regular. Murmurs & Other Heart Sounds:Auscultation of the heart reveals- No Murmurs.  Abdomen Inspection:-Inspeection Normal. Palpation/Percussion:Note:No mass. Palpation and Percussion of the abdomen reveal- Non Tender, Non Distended + BS, no rebound or guarding.    Neurologic Cranial Nerve exam:- CN III-XII intact(No nystagmus), symmetric smile. Strength:- 5/5 equal and symmetric strength both upper and lower extremities.  Rectal exam- sphincter normal. Prostate smooth. No nodules. Stool card negative for blood.  No obvious hemorrhoids seen on inspection.  Skin-normal. But left side penineum has small raised tender area(last week more raised and yellow drainage) Faint tender still.      Assessment & Plan:  For you wellness exam today I have ordered cbc, cmp, tsh, lipid panel, psa, ua and hiv.  Vaccine appear up. Pt at  tail end of the flu. Improved but energy still lagging.  Recommend exercise and healthy diet.  We will let you know lab results as they come in.  Follow up date appointment will be determined after lab review.   For history of intermittent bright red blood when wiping after bowel movement and some occasional blood in toilet water, I am referring you to gastroenterologist.  Your stool card test was negative for blood today in the office but I still want you to do ifob at home and return that.  This may help direct GI workup.  For history of smoking, I want to get chest x-ray.  For recent small abscess and perineal region with subsequent drainage,  I am prescribing doxycycline for 7 days.  For cerumen impaction, i recommend using Debrox over-the-counter.  After wax is softened and moist you could try to clean lightly with Q-tip.  You may be successful in removing wax but if wax becomes more impacted then would need lavage.  Mackie Pai, PA-C

## 2017-07-16 LAB — COMPREHENSIVE METABOLIC PANEL
ALBUMIN: 3.9 g/dL (ref 3.5–5.2)
ALT: 23 U/L (ref 0–53)
AST: 19 U/L (ref 0–37)
Alkaline Phosphatase: 75 U/L (ref 39–117)
BUN: 10 mg/dL (ref 6–23)
CALCIUM: 9.4 mg/dL (ref 8.4–10.5)
CHLORIDE: 103 meq/L (ref 96–112)
CO2: 31 meq/L (ref 19–32)
CREATININE: 0.82 mg/dL (ref 0.40–1.50)
GFR: 125.61 mL/min (ref >60.00)
Glucose, Bld: 83 mg/dL (ref 70–99)
Potassium: 3.9 mEq/L (ref 3.5–5.1)
Sodium: 139 mEq/L (ref 135–145)
Total Bilirubin: 0.3 mg/dL (ref 0.2–1.2)
Total Protein: 6.6 g/dL (ref 6.0–8.3)

## 2017-07-16 LAB — URINALYSIS, ROUTINE W REFLEX MICROSCOPIC
Bilirubin Urine: NEGATIVE
Hgb urine dipstick: NEGATIVE
KETONES UR: NEGATIVE
Leukocytes, UA: NEGATIVE
Nitrite: NEGATIVE
PH: 6 (ref 5.0–8.0)
RBC / HPF: NONE SEEN (ref 0–?)
SPECIFIC GRAVITY, URINE: 1.015 (ref 1.000–1.030)
TOTAL PROTEIN, URINE-UPE24: NEGATIVE
URINE GLUCOSE: NEGATIVE
Urobilinogen, UA: 0.2 (ref 0.0–1.0)

## 2017-07-16 LAB — LIPID PANEL
CHOL/HDL RATIO: 3
Cholesterol: 188 mg/dL (ref 0–200)
HDL: 57.7 mg/dL (ref >39.00)
LDL CALC: 120 mg/dL — AB (ref 0–99)
NONHDL: 130.57
Triglycerides: 54 mg/dL (ref 0.0–149.0)
VLDL: 10.8 mg/dL (ref 0.0–40.0)

## 2017-07-16 LAB — CBC WITH DIFFERENTIAL/PLATELET
BASOS PCT: 1.2 % (ref 0.0–3.0)
Basophils Absolute: 0.1 10*3/uL (ref 0.0–0.1)
EOS ABS: 0.1 10*3/uL (ref 0.0–0.7)
Eosinophils Relative: 2 % (ref 0.0–5.0)
HEMATOCRIT: 41.2 % (ref 39.0–52.0)
HEMOGLOBIN: 14 g/dL (ref 13.0–17.0)
LYMPHS PCT: 42.2 % (ref 12.0–46.0)
Lymphs Abs: 1.8 10*3/uL (ref 0.7–4.0)
MCHC: 33.9 g/dL (ref 30.0–36.0)
MCV: 92 fl (ref 78.0–100.0)
MONO ABS: 0.4 10*3/uL (ref 0.1–1.0)
Monocytes Relative: 8.6 % (ref 3.0–12.0)
Neutro Abs: 1.9 10*3/uL (ref 1.4–7.7)
Neutrophils Relative %: 46 % (ref 43.0–77.0)
PLATELETS: 252 10*3/uL (ref 150.0–400.0)
RBC: 4.48 Mil/uL (ref 4.22–5.81)
RDW: 13 % (ref 11.5–15.5)
WBC: 4.2 10*3/uL (ref 4.0–10.5)

## 2017-07-16 LAB — PSA: PSA: 0.44 ng/mL (ref 0.10–4.00)

## 2017-07-16 LAB — TSH: TSH: 0.75 u[IU]/mL (ref 0.35–4.50)

## 2017-07-16 LAB — HIV ANTIBODY (ROUTINE TESTING W REFLEX): HIV 1&2 Ab, 4th Generation: NONREACTIVE

## 2017-07-29 ENCOUNTER — Telehealth: Payer: Self-pay | Admitting: Medical

## 2017-07-29 NOTE — Telephone Encounter (Signed)
Please get old Gi records from [facility] and refer to [facility].   Thanks,

## 2017-09-10 ENCOUNTER — Ambulatory Visit: Payer: Self-pay | Admitting: *Deleted

## 2017-09-10 DIAGNOSIS — K648 Other hemorrhoids: Secondary | ICD-10-CM | POA: Diagnosis not present

## 2017-09-10 DIAGNOSIS — K625 Hemorrhage of anus and rectum: Secondary | ICD-10-CM | POA: Diagnosis not present

## 2017-09-10 DIAGNOSIS — K649 Unspecified hemorrhoids: Secondary | ICD-10-CM | POA: Diagnosis not present

## 2017-09-10 NOTE — Telephone Encounter (Signed)
FYI to PCP

## 2017-09-10 NOTE — Telephone Encounter (Signed)
Agree see GI or ED.

## 2017-09-10 NOTE — Telephone Encounter (Signed)
Pt called with a large amount of rectal bleeding this morning. He stated the commode was full of blood and no stool. He denies abd pain, nausea or vomiting or dizziness. He is calling to find out the name of the GI that he was referred to. He stated he had this happened about a week ago and he had more bleeding episodes.  Per protocol, pt advised to go to emergency department to be examined. [facility] at [facility] notified. Pt is at work and said that he would wait about 2 more hours before going to the ED. Advised pt if he starts feeling worse to call 911. Pt voiced understanding.  Reason for Disposition . [1] MODERATE rectal bleeding (small blood clots, passing blood without stool, or toilet water turns red) AND [2] more than once a day  Answer Assessment - Initial Assessment Questions 1. APPEARANCE of BLOOD: "What color is it?" "Is it passed separately, on the surface of the stool, or mixed in with the stool?"      Red blood and no stool 2. AMOUNT: "How much blood was passed?"      Commode with blood large amount 3. FREQUENCY: "How many times has blood been passed with the stools?"      Just once today 4. ONSET: "When was the blood first seen in the stools?" (Days or weeks)      About a week ago 5. DIARRHEA: "Is there also some diarrhea?" If so, ask: "How many diarrhea stools were passed in past 24 hours?"      no 6. CONSTIPATION: "Do you have constipation?" If so, "How bad is it?"     no 7. RECURRENT SYMPTOMS: "Have you had blood in your stools before?" If so, ask: "When was the last time?" and "What happened that time?"      Yes, went to see his pcp 8. BLOOD THINNERS: "Do you take any blood thinners?" (e.g., Coumadin/warfarin, Pradaxa/dabigatran, aspirin)     no 9. OTHER SYMPTOMS: "Do you have any other symptoms?"  (e.g., abdominal pain, vomiting, dizziness, fever)     no 10. PREGNANCY: "Is there any chance you are pregnant?" "When was your last menstrual period?"        no  Protocols used: RECTAL BLEEDING-A-AH

## 2017-09-10 NOTE — Telephone Encounter (Signed)
Pt given number of [facility]  [phone].Marland Kitchen He is going to give them a call.

## 2018-03-01 ENCOUNTER — Other Ambulatory Visit: Payer: Self-pay | Admitting: Medical

## 2018-03-01 MED ORDER — SILDENAFIL CITRATE 20 MG PO TABS: 20.0000 mg | ORAL_TABLET | ORAL | 0 refills | Status: DC

## 2018-03-01 NOTE — Telephone Encounter (Signed)
Requested medication (s) are due for refill today: yes  Requested medication (s) are on the active medication list: yes  Last refill:  06/16/17  Future visit scheduled: yes 03/03/18  Notes to clinic:  Last BO 143/87 on 07/15/17     Requested Prescriptions  Pending Prescriptions Disp Refills   sildenafil (REVATIO) 20 MG tablet 50 tablet 0    Sig: Take 1 tablet (20 mg total) by mouth 1 day or 1 dose. Take 2-4 tablets daily as needed. NO MORE THAN 4 TABLETS IN A 24 HOUR PERIOD.     Urology: Erectile Dysfunction Agents Failed - 03/01/2018  9:40 AM      Failed - Last BP in normal range    BP Readings from Last 1 Encounters:  07/15/17 (!) 143/87         Passed - Valid encounter within last 12 months    Recent Outpatient Visits          7 months ago Wellness examination   [facility] at [place], [place]   7 months ago Flu   [facility] [place], PA-C   1 year ago Acute midline low back pain without sciatica   [facility] at [place], [place]   1 year ago Acute maxillary sinusitis, recurrence not specified   [facility] at [place], [place]   2 years ago Loose stools   [facility] at [place], [employer]            In 2 days Saguier, Percell Miller, PA-C [facility] at [employer], [facility]

## 2018-03-01 NOTE — Telephone Encounter (Signed)
Copied from [place] [id number]. Topic: Quick Communication - Rx Refill/Question >> Mar 01, 2018  9:28 AM Yvette Rack wrote: Medication: sildenafil (REVATIO) 20 MG tablet  Has the patient contacted their pharmacy? No.pt at work he tried to put in request through [facility] but it didn't work pt doesn't have bottle with him (Agent: If no, request that the patient contact the pharmacy for the refill.) (Agent: If yes, when and what did the pharmacy advise?)  Preferred Pharmacy (with phone number or street name):     [id number] - [place], [place] - [address] AT [address] [phone] (Phone) [phone] (Fax)    Agent: Please be advised that RX refills may take up to 3 business days. We ask that you follow-up with your pharmacy.

## 2018-03-03 ENCOUNTER — Ambulatory Visit: Payer: 59 | Admitting: Medical

## 2018-03-03 ENCOUNTER — Encounter: Payer: Self-pay | Admitting: Medical

## 2018-03-03 VITALS — BP 131/88 | HR 77 | Temp 98.5°F | Resp 16 | Ht 69.0 in | Wt 164.6 lb

## 2018-03-03 DIAGNOSIS — J40 Bronchitis, not specified as acute or chronic: Secondary | ICD-10-CM

## 2018-03-03 DIAGNOSIS — R52 Pain, unspecified: Secondary | ICD-10-CM | POA: Diagnosis not present

## 2018-03-03 DIAGNOSIS — J0100 Acute maxillary sinusitis, unspecified: Secondary | ICD-10-CM | POA: Diagnosis not present

## 2018-03-03 LAB — POC INFLUENZA A&B (BINAX/QUICKVUE)
INFLUENZA B, POC: NEGATIVE
Influenza A, POC: NEGATIVE

## 2018-03-03 MED ORDER — BENZONATATE 100 MG PO CAPS: 100.0000 mg | ORAL_CAPSULE | Freq: Three times a day (TID) | ORAL | 0 refills | Status: DC | PRN

## 2018-03-03 MED ORDER — FLUTICASONE PROPIONATE 50 MCG/ACT NA SUSP: 2.0000 | Freq: Every day | NASAL | 1 refills | Status: DC

## 2018-03-03 MED ORDER — DOXYCYCLINE HYCLATE 100 MG PO TABS: 100.0000 mg | ORAL_TABLET | Freq: Two times a day (BID) | ORAL | 0 refills | Status: DC

## 2018-03-03 NOTE — Progress Notes (Signed)
Subjective:    Patient ID: Jon Reyes, male    DOB: 07/29/1960, 57 y.o.   MRN: [id number]  HPI  Pt in for 2 weeks of feeling sick. He states started out runny nose, sneezing, nasal congestin and chest congestion since onset. Pt states early on felt chills. Some chills early this week. Some myalgias over past week.   Pt has some yellowish mucus when he blows out mucus from nose. Mild cough.    Review of Systems  Constitutional: Negative for chills, fatigue and fever.  HENT: Positive for congestion, sinus pressure and sinus pain. Negative for postnasal drip, sore throat and trouble swallowing.   Respiratory: Negative for cough, chest tightness, shortness of breath and wheezing.   Cardiovascular: Negative for chest pain and palpitations.  Gastrointestinal: Negative for abdominal pain, nausea, rectal pain and vomiting.  Musculoskeletal: Positive for myalgias. Negative for back pain.  Skin: Negative for rash.  Hematological: Negative for adenopathy. Does not bruise/bleed easily.  Psychiatric/Behavioral: Negative for behavioral problems and confusion.    Past Medical History:  Diagnosis Date  . Allergy    nasal congestion recently. Seeing allergist tomorrow.  . Asthma   . Depression   . Hemorrhoids   . Hyperthyroidism 02/28/2015     Social History   Socioeconomic History  . Marital status: Single    Spouse name: Not on file  . Number of children: Not on file  . Years of education: Not on file  . Highest education level: Not on file  Occupational History  . Not on file  Social Needs  . Financial resource strain: Not on file  . Food insecurity:    Worry: Not on file    Inability: Not on file  . Transportation needs:    Medical: Not on file    Non-medical: Not on file  Tobacco Use  . Smoking status: Never Smoker  . Smokeless tobacco: Never Used  Substance and Sexual Activity  . Alcohol use: Yes    Comment: very rare seldom.  . Drug use: No  . Sexual activity:  Yes  Lifestyle  . Physical activity:    Days per week: Not on file    Minutes per session: Not on file  . Stress: Not on file  Relationships  . Social connections:    Talks on phone: Not on file    Gets together: Not on file    Attends religious service: Not on file    Active member of club or organization: Not on file    Attends meetings of clubs or organizations: Not on file    Relationship status: Not on file  . Intimate partner violence:    Fear of current or ex partner: Not on file    Emotionally abused: Not on file    Physically abused: Not on file    Forced sexual activity: Not on file  Other Topics Concern  . Not on file  Social History Narrative  . Not on file    Past Surgical History:  Procedure Laterality Date  . ABDOMINAL HERNIA REPAIR    . HEMORRHOID SURGERY    . TOE AMPUTATION     right foot    Family History  Problem Relation Age of Onset  . Heart disease Mother   . Heart disease Father   . Thyroid disease Neg Hx     No Known Allergies  Current Outpatient Medications on File Prior to Visit  Medication Sig Dispense Refill  . sildenafil (REVATIO) 20 MG  tablet Take 1 tablet (20 mg total) by mouth 1 day or 1 dose. Take 2-4 tablets daily as needed. NO MORE THAN 4 TABLETS IN A 24 HOUR PERIOD. 50 tablet 0   No current facility-administered medications on file prior to visit.     BP 131/88   Pulse 77   Temp 98.5 F (36.9 C) (Oral)   Resp 16   Ht 5\' 9"  (1.753 m)   Wt 164 lb 9.6 oz (74.7 kg)   SpO2 100%   BMI 24.31 kg/m       Objective:   Physical Exam  General  Mental Status - Alert. General Appearance - Well groomed. Not in acute distress.  Skin Rashes- No Rashes.  HEENT Head- Normal. Ear Auditory Canal - Left- Normal. Right - Normal.Tympanic Membrane- Left- Normal. Right- Normal. Eye Sclera/Conjunctiva- Left- Normal. Right- Normal. Nose & Sinuses Nasal Mucosa- Left-  Boggy and Congested. Right-  Boggy and  Congested.Bilateral  maxillary and frontal sinus pressure. Mouth & Throat Lips: Upper Lip- Normal: no dryness, cracking, pallor, cyanosis, or vesicular eruption. Lower Lip-Normal: no dryness, cracking, pallor, cyanosis or vesicular eruption. Buccal Mucosa- Bilateral- No Aphthous ulcers. Oropharynx- No Discharge or Erythema. Tonsils: Characteristics- Bilateral- No Erythema or Congestion. Size/Enlargement- Bilateral- No enlargement. Discharge- bilateral-None.  Neck Neck- Supple. No Masses.   Chest and Lung Exam Auscultation: Breath Sounds:-Clear even and unlabored.  Cardiovascular Auscultation:Rythm- Regular, rate and rhythm. Murmurs & Other Heart Sounds:Ausculatation of the heart reveal- No Murmurs.  Lymphatic Head & Neck General Head & Neck Lymphatics: Bilateral: Description- No Localized lymphadenopathy.       Assessment & Plan:   You appear to have bronchitis and sinusitis. Rest hydrate and tylenol for fever. I am prescribing cough medicine benzonatate, and doxycycline antibiotic. For your nasal congestion rx flonase.  You should gradually get better. If not then notify us and would recommend a chest xray.  Flu test done today.(flu test was negative. Also myalgia for about one week so went past treatment window time frame even if false negative)   Follow up in 7-10 days or as needed  [employer], [employer]

## 2018-03-03 NOTE — Patient Instructions (Signed)
You appear to have bronchitis and sinusitis. Rest hydrate and tylenol for fever. I am prescribing cough medicine benzonatate, and doxycycline antibiotic. For your nasal congestion rx flonase.  You should gradually get better. If not then notify us and would recommend a chest xray.  Flu test done today.    Follow up in 7-10 days or as needed

## 2018-03-26 ENCOUNTER — Ambulatory Visit (INDEPENDENT_AMBULATORY_CARE_PROVIDER_SITE_OTHER): Payer: 59

## 2018-03-26 DIAGNOSIS — Z23 Encounter for immunization: Secondary | ICD-10-CM | POA: Diagnosis not present

## 2018-09-28 ENCOUNTER — Encounter: Payer: Self-pay | Admitting: Medical

## 2018-09-28 ENCOUNTER — Other Ambulatory Visit: Payer: Self-pay

## 2018-09-28 ENCOUNTER — Other Ambulatory Visit: Payer: Self-pay | Admitting: Medical

## 2018-09-28 ENCOUNTER — Ambulatory Visit (INDEPENDENT_AMBULATORY_CARE_PROVIDER_SITE_OTHER): Payer: 59 | Admitting: Medical

## 2018-09-28 VITALS — Ht 69.0 in | Wt 162.6 lb

## 2018-09-28 DIAGNOSIS — F32A Depression, unspecified: Secondary | ICD-10-CM

## 2018-09-28 DIAGNOSIS — F419 Anxiety disorder, unspecified: Secondary | ICD-10-CM | POA: Diagnosis not present

## 2018-09-28 DIAGNOSIS — N529 Male erectile dysfunction, unspecified: Secondary | ICD-10-CM

## 2018-09-28 DIAGNOSIS — J309 Allergic rhinitis, unspecified: Secondary | ICD-10-CM

## 2018-09-28 DIAGNOSIS — F329 Major depressive disorder, single episode, unspecified: Secondary | ICD-10-CM | POA: Diagnosis not present

## 2018-09-28 MED ORDER — SILDENAFIL CITRATE 20 MG PO TABS: 20.0000 mg | ORAL_TABLET | ORAL | 0 refills | Status: DC

## 2018-09-28 MED ORDER — FLUTICASONE PROPIONATE 50 MCG/ACT NA SUSP: 2.0000 | Freq: Every day | NASAL | 1 refills | Status: AC

## 2018-09-28 NOTE — Patient Instructions (Addendum)
Allergies controlled. Refilled flonase. ED history and does well with revation. No side effects. Cost not too  Much.  Follow up last week august or early September wellness exam in office. But if insurance allows virtual wellness may schedule earlier.  One hour after office visit sent my chart message. See ros. Will rx sertraline and clonazepam for anxiety. Offered counseling as option. Follow up in 2 weeks or as needed

## 2018-09-28 NOTE — Progress Notes (Addendum)
Subjective:    Patient ID: Jon Reyes, male    DOB: 02/08/1961, 58 y.o.   MRN: [id number]  HPI  Virtual Visit via Video Note  I connected with Jon Reyes on 09/28/18 at  2:20 PM EDT by a video enabled telemedicine application and verified that I am speaking with the correct person using two identifiers.  Location: Patient: home Provider: home  Pt did not check his bp or pulse since does not have a machine.   I discussed the limitations of evaluation and management by telemedicine and the availability of in person appointments. The patient expressed understanding and agreed to proceed.    History of Present Illness: Pt give me update that his allergies are overall well controlled. He uses flonase for allergies. Controls his symptoms. Occasional nasal congestion with ear pain. If used flonase controlls symptoms.  Pt also has ED and does well with Revatio. No side effects. No cardiac symptoms.     Observations/Objective: General-no acute distress, pleasant, oriented. Lungs- on inspection lungs appear unlabored. Neck- no tracheal deviation or jvd on inspection. Neuro- gross motor function appears intact.  Assessment and Plan: Allergies controlled. Refilled flonase. ED history and does well with revation. No side effects. Cost not too  Much.  Follow up last week august or early September wellness exam in office. But if insurance allows virtual wellness may schedule earlier.  One hour after office visit sent my chart message. See ros. Will rx sertraline and clonazepam for anxiety. Offered counseling as option. Follow up in 2 weeks or as needed Follow Up Instructions:    I discussed the assessment and treatment plan with the patient. The patient was provided an opportunity to ask questions and all were answered. The patient agreed with the plan and demonstrated an understanding of the instructions.   The patient was advised to call back or seek an in-person  evaluation if the symptoms worsen or if the condition fails to improve as anticipated.  I provided 15 minutes of non-face-to-face time during this encounter.   Mackie Pai, PA-C    Review of Systems  Constitutional: Negative for chills, fatigue and fever.  HENT: Positive for congestion. Negative for dental problem, ear discharge, hearing loss, sinus pressure, sinus pain and sneezing.   Respiratory: Negative for cough, chest tightness, shortness of breath and wheezing.   Cardiovascular: Negative for chest pain and palpitations.  Gastrointestinal: Negative for abdominal pain.  Genitourinary: Negative for decreased urine volume, difficulty urinating, flank pain, frequency, hematuria, penile swelling and scrotal swelling.       ED.  Musculoskeletal: Negative for back pain.  Skin: Negative for rash.  Neurological: Negative for dizziness, weakness and light-headedness.  Hematological: Negative for adenopathy. Does not bruise/bleed easily.  Psychiatric/Behavioral: Positive for dysphoric mood and sleep disturbance. Negative for behavioral problems, confusion and suicidal ideas. The patient is nervous/anxious.        One hour after visit he sent my chart message and admitted one week depression and anxiety. 54 yo son passed away last week. He is willing to try meds. Will start on setraline and clonazepam. Follow up in 2 weeks.   Past Medical History:  Diagnosis Date  . Allergy    nasal congestion recently. Seeing allergist tomorrow.  . Asthma   . Depression   . Hemorrhoids   . Hyperthyroidism 02/28/2015     Social History   Socioeconomic History  . Marital status: Single    Spouse name: Not on file  . Number of children:  Not on file  . Years of education: Not on file  . Highest education level: Not on file  Occupational History  . Not on file  Social Needs  . Financial resource strain: Not on file  . Food insecurity:    Worry: Not on file    Inability: Not on file  .  Transportation needs:    Medical: Not on file    Non-medical: Not on file  Tobacco Use  . Smoking status: Never Smoker  . Smokeless tobacco: Never Used  Substance and Sexual Activity  . Alcohol use: Yes    Comment: very rare seldom.  . Drug use: No  . Sexual activity: Yes  Lifestyle  . Physical activity:    Days per week: Not on file    Minutes per session: Not on file  . Stress: Not on file  Relationships  . Social connections:    Talks on phone: Not on file    Gets together: Not on file    Attends religious service: Not on file    Active member of club or organization: Not on file    Attends meetings of clubs or organizations: Not on file    Relationship status: Not on file  . Intimate partner violence:    Fear of current or ex partner: Not on file    Emotionally abused: Not on file    Physically abused: Not on file    Forced sexual activity: Not on file  Other Topics Concern  . Not on file  Social History Narrative  . Not on file    Past Surgical History:  Procedure Laterality Date  . ABDOMINAL HERNIA REPAIR    . HEMORRHOID SURGERY    . TOE AMPUTATION     right foot    Family History  Problem Relation Age of Onset  . Heart disease Mother   . Heart disease Father   . Thyroid disease Neg Hx     No Known Allergies  No current outpatient medications on file prior to visit.   No current facility-administered medications on file prior to visit.     Ht 5\' 9"  (1.753 m)   Wt 162 lb 9.6 oz (73.8 kg)   BMI 24.01 kg/m       Objective:   Physical Exam        Assessment & Plan:

## 2018-09-29 ENCOUNTER — Encounter: Payer: Self-pay | Admitting: Medical

## 2018-09-29 MED ORDER — CLONAZEPAM 0.5 MG PO TABS: 0.5000 mg | ORAL_TABLET | Freq: Two times a day (BID) | ORAL | 0 refills | Status: DC | PRN

## 2018-09-29 MED ORDER — SERTRALINE HCL 25 MG PO TABS: 25.0000 mg | ORAL_TABLET | Freq: Every day | ORAL | 0 refills | Status: DC

## 2018-09-29 NOTE — Addendum Note (Signed)
Addended by: Anabel Halon on: 09/29/2018 05:52 PM   Modules accepted: Orders

## 2018-09-30 ENCOUNTER — Telehealth: Payer: Self-pay | Admitting: Medical

## 2018-09-30 MED ORDER — SERTRALINE HCL 25 MG PO TABS: 25.0000 mg | ORAL_TABLET | Freq: Every day | ORAL | 0 refills | Status: DC

## 2018-09-30 NOTE — Telephone Encounter (Signed)
Tried to call pharmacy to cancel rx of clonazepam sent yesterday. But no one picked up after waiting 10 minutes.

## 2018-09-30 NOTE — Telephone Encounter (Signed)
Pt needs 2 week follow up virtual visit.

## 2018-09-30 NOTE — Telephone Encounter (Signed)
Pt scheduled 10/14/18

## 2018-10-01 ENCOUNTER — Encounter: Payer: Self-pay | Admitting: Medical
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
## 2018-10-14 ENCOUNTER — Ambulatory Visit (INDEPENDENT_AMBULATORY_CARE_PROVIDER_SITE_OTHER): Payer: 59 | Admitting: Medical

## 2018-10-14 ENCOUNTER — Other Ambulatory Visit: Payer: Self-pay

## 2018-10-14 ENCOUNTER — Encounter: Payer: Self-pay | Admitting: Medical

## 2018-10-14 VITALS — BP 136/86 | Wt 158.0 lb

## 2018-10-14 DIAGNOSIS — N529 Male erectile dysfunction, unspecified: Secondary | ICD-10-CM

## 2018-10-14 DIAGNOSIS — J309 Allergic rhinitis, unspecified: Secondary | ICD-10-CM

## 2018-10-14 DIAGNOSIS — F419 Anxiety disorder, unspecified: Secondary | ICD-10-CM | POA: Diagnosis not present

## 2018-10-14 DIAGNOSIS — F329 Major depressive disorder, single episode, unspecified: Secondary | ICD-10-CM

## 2018-10-14 DIAGNOSIS — F32A Depression, unspecified: Secondary | ICD-10-CM

## 2018-10-14 MED ORDER — TRAZODONE HCL 50 MG PO TABS: 25.0000 mg | ORAL_TABLET | Freq: Every evening | ORAL | 3 refills | Status: DC | PRN

## 2018-10-14 MED ORDER — SERTRALINE HCL 50 MG PO TABS: 50.0000 mg | ORAL_TABLET | Freq: Every day | ORAL | 3 refills | Status: DC
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
## 2018-10-15 ENCOUNTER — Encounter: Payer: Self-pay | Admitting: Medical

## 2018-10-18 ENCOUNTER — Telehealth: Payer: Self-pay | Admitting: Medical

## 2018-10-18 ENCOUNTER — Encounter: Payer: Self-pay | Admitting: Medical

## 2018-10-18 NOTE — Telephone Encounter (Signed)
Jon Reyes  -- I spoke with [facility]. They are NOT needing records but they do require a Referral from the PCP with any pertinent information. Can you place referral?

## 2018-10-18 NOTE — Telephone Encounter (Signed)
Psychiatrist / [facility] fax number is [phone]. 09-27-2018 note and 10-14-2018 note. Will you fax those today.

## 2018-10-19 ENCOUNTER — Encounter: Payer: Self-pay | Admitting: Medical

## 2018-10-19 ENCOUNTER — Telehealth (HOSPITAL_COMMUNITY): Payer: Self-pay | Admitting: Licensed Clinical Social Worker

## 2018-10-21 NOTE — Progress Notes (Signed)
Subjective:    Patient ID: Jon Reyes, male    DOB: [birth year], 58 y.o.   MRN: [id number]  HPI     Follow up for depression and anxiety. Pt is on setraline and klonopin.  Had rx'd tazadone for insomnia.  He has been struggling with mood since 45 yo son passed away.   No current suicidal ideation. Though in past he had brief transient thoughts. No plan made. Gave list of psychiatrist to get pt scheduled and referred. 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  Pt states day before yesterday he talked to psychiatrist through [facility]. Pt states he is still feeling depression and anxiety. 3 days ago he states he did want to live. But no plan made. Not feeling like this presenlty. The other day he considered going to [facility] as I had advised him in the past. I don't see recent psychiatrist or psychologist note.  Pt states recently he had loose stools recently. He also states that he saw some blood recently. Pt in past evaluated by GI at [facility]. He states work up was negative.He states some bright red blood on tissue when he wiped. No fever, no chills or sweats with that. This loose  started last week. He states yesterday some achiness in mid to rt side. About 3-4 loose stools.    Review of Systems  Constitutional: Negative for chills, fatigue and fever.  Respiratory: Negative for cough, chest tightness, shortness of breath and wheezing.   Cardiovascular: Negative for chest pain and palpitations.  Gastrointestinal: Negative for abdominal pain.  Musculoskeletal: Negative for back pain.  Skin: Negative for rash.  Neurological: Negative for dizziness, syncope, weakness, numbness and headaches.  Hematological: Negative for adenopathy. Does not bruise/bleed easily.  Psychiatric/Behavioral: Positive for dysphoric mood. Negative for behavioral problems, confusion, sleep disturbance and suicidal ideas. The patient is nervous/anxious.     Past Medical History:  Diagnosis  Date  . Allergy    nasal congestion recently. Seeing allergist tomorrow.  . Asthma   . Depression   . Hemorrhoids   . Hyperthyroidism 02/28/2015     Social History   Socioeconomic History  . Marital status: Single    Spouse name: Not on file  . Number of children: Not on file  . Years of education: Not on file  . Highest education level: Not on file  Occupational History  . Not on file  Social Needs  . Financial resource strain: Not on file  . Food insecurity    Worry: Not on file    Inability: Not on file  . Transportation needs    Medical: Not on file    Non-medical: Not on file  Tobacco Use  . Smoking status: Never Smoker  . Smokeless tobacco: Never Used  Substance and Sexual Activity  . Alcohol use: Yes    Comment: very rare seldom.  . Drug use: No  . Sexual activity: Yes  Lifestyle  . Physical activity    Days per week: Not on file    Minutes per session: Not on file  . Stress: Not on file  Relationships  . Social Herbalist on phone: Not on file    Gets together: Not on file    Attends religious service: Not on file    Active member of club or organization: Not on file    Attends meetings of clubs or organizations: Not on file    Relationship status: Not on file  .  Intimate partner violence    Fear of current or ex partner: Not on file    Emotionally abused: Not on file    Physically abused: Not on file    Forced sexual activity: Not on file  Other Topics Concern  . Not on file  Social History Narrative  . Not on file    Past Surgical History:  Procedure Laterality Date  . ABDOMINAL HERNIA REPAIR    . HEMORRHOID SURGERY    . TOE AMPUTATION     right foot    Family History  Problem Relation Age of Onset  . Heart disease Mother   . Heart disease Father   . Thyroid disease Neg Hx     No Known Allergies  Current Outpatient Medications on File Prior to Visit  Medication Sig Dispense Refill  . clonazePAM (KLONOPIN) 0.5 MG tablet  Take 1 tablet (0.5 mg total) by mouth 2 (two) times daily as needed for anxiety. 14 tablet 0  . fluticasone (FLONASE) 50 MCG/ACT nasal spray Place 2 sprays into both nostrils daily. 16 g 1  . sertraline (ZOLOFT) 25 MG tablet Take 1 tablet (25 mg total) by mouth daily. 30 tablet 0  . sertraline (ZOLOFT) 50 MG tablet Take 1 tablet (50 mg total) by mouth daily. 30 tablet 3  . sildenafil (REVATIO) 20 MG tablet Take 1 tablet (20 mg total) by mouth 1 day or 1 dose. Take 2-4 tablets daily as needed. NO MORE THAN 4 TABLETS IN A 24 HOUR PERIOD. 50 tablet 0  . traZODone (DESYREL) 50 MG tablet Take 0.5-1 tablets (25-50 mg total) by mouth at bedtime as needed for sleep. 30 tablet 3   No current facility-administered medications on file prior to visit.     There were no vitals taken for this visit.      Objective:   Physical Exam  General-no acute distress, pleasant, oriented. Lungs- on inspection lungs appear unlabored. Neck- no tracheal deviation or jvd on inspection. Neuro- gross motor function appears intact. Abdomen- pt states faint mid lower stomach and mild rt side. But no description over rt lower quadrant.      Assessment & Plan:     Patient's depression, anxiety and insomnia still present.  Depression varies from day-to-day.  About 3 days ago he had a rough day as described on the history of present illness.  Insomnia varies some days described able to sleep.  But some days still having difficulty.  At this point I am going to prioritize trying to communicate with behavioral health and make sure that he saw psychiatrist.  Unclear whether or not he did as the note is not available in epic/that I can see.  He might just seen a psychologist.  Patient is going to communicate patient's name and title to me.  As well as a number.  Going to try to call over there and explain how patient is still having some intermittent suicidal ideations.  Want him to get opinion of psychiatrist and maybe make  medication changes.  Patient understands that if he has worsening signs symptoms/particularly suicidal ideations that he should go to [facility] long ED.  I will see patient back in the office in 2 weeks or as needed.  He is going to start working 1 week from today and will discuss how he is handling work.  Patient also has some recent abdomen pain as described on history of present illness as well as a loose stools and some bright red blood  in the stools.  Based on this presentation will prescribe Cipro and Flagyl for possible diverticulitis or colitis.  But also will go ahead and place referral to GI MD at [facility] as they did a work-up for blood in stool in the past.  Advised patient that he can go ahead and call Romelle Starcher since he is an established patient and explained to them that referral has been placed.  If patient has worsening abdomen pain/signs symptoms explain he can be seen in the emergency department for CT imaging and a work-up in that event.   Follow-up in 2 weeks or as needed.  25 minute spent with patient.  50% of time spent counseling patient regarding plan going forward for diagnosis

## 2018-10-22 ENCOUNTER — Ambulatory Visit (INDEPENDENT_AMBULATORY_CARE_PROVIDER_SITE_OTHER): Payer: 59 | Admitting: Medical

## 2018-10-22 ENCOUNTER — Encounter: Payer: Self-pay | Admitting: Medical

## 2018-10-22 ENCOUNTER — Other Ambulatory Visit: Payer: Self-pay

## 2018-10-22 VITALS — Wt 158.0 lb

## 2018-10-22 DIAGNOSIS — R109 Unspecified abdominal pain: Secondary | ICD-10-CM

## 2018-10-22 DIAGNOSIS — F329 Major depressive disorder, single episode, unspecified: Secondary | ICD-10-CM

## 2018-10-22 DIAGNOSIS — K921 Melena: Secondary | ICD-10-CM

## 2018-10-22 DIAGNOSIS — G4700 Insomnia, unspecified: Secondary | ICD-10-CM | POA: Diagnosis not present

## 2018-10-22 DIAGNOSIS — R195 Other fecal abnormalities: Secondary | ICD-10-CM

## 2018-10-22 DIAGNOSIS — F419 Anxiety disorder, unspecified: Secondary | ICD-10-CM | POA: Diagnosis not present

## 2018-10-22 DIAGNOSIS — F32A Depression, unspecified: Secondary | ICD-10-CM

## 2018-10-22 MED ORDER — CIPROFLOXACIN HCL 500 MG PO TABS: 500.0000 mg | ORAL_TABLET | Freq: Two times a day (BID) | ORAL | 0 refills | Status: DC

## 2018-10-22 MED ORDER — METRONIDAZOLE 500 MG PO TABS: 500.0000 mg | ORAL_TABLET | Freq: Three times a day (TID) | ORAL | 0 refills | Status: DC

## 2018-10-22 NOTE — Patient Instructions (Addendum)
Patient's depression, anxiety and insomnia still present.  Depression varies from day-to-day.  About 3 days ago he had a rough day as described on the history of present illness.  Insomnia varies some days described able to sleep.  But some days still having difficulty.  At this point I am going to prioritize trying to communicate with behavioral health and make sure that he saw psychiatrist.  Unclear whether or not he did as the note is not available in epic/that I can see.  He might just seen a psychologist.  Patient is going to communicate patient's name and title to me.  As well as a number.  Going to try to call over there and explain how patient is still having some intermittent suicidal ideations.  Want him to get opinion of psychiatrist and maybe make medication changes.  Patient understands that if he has worsening signs symptoms/particularly suicidal ideations that he should go to [facility] long ED.  I will see patient back in the office in 2 weeks or as needed.  He is going to start working 1 week from today and will discuss how he is handling work.  Patient also has some recent abdomen pain as described on history of present illness as well as a loose stools and some bright red blood in the stools.  Based on this presentation will prescribe Cipro and Flagyl for possible diverticulitis or colitis.  But also will go ahead and place referral to GI MD at [facility] as they did a work-up for blood in stool in the past.  Advised patient that he can go ahead and call Romelle Starcher since he is an established patient and explained to them that referral has been placed.  If patient has worsening abdomen pain/signs symptoms explain he can be seen in the emergency department for CT imaging and a work-up in that event.   Follow-up in 2 weeks or as needed.

## 2018-10-25 NOTE — Telephone Encounter (Signed)
Aeirelle called back, information released about referral.

## 2018-10-26 ENCOUNTER — Encounter: Payer: Self-pay | Admitting: Medical

## 2018-10-27 ENCOUNTER — Inpatient Hospital Stay (HOSPITAL_COMMUNITY)
Admission: RE | Admit: 2018-10-27 | Discharge: 2018-10-29 | DRG: 885 | Disposition: A | Discharge status: Home / Self Care | Payer: 59 | Attending: Psychiatry | Admitting: Psychiatry

## 2018-10-27 DIAGNOSIS — Z79899 Other long term (current) drug therapy: Secondary | ICD-10-CM | POA: Diagnosis not present

## 2018-10-27 DIAGNOSIS — Z1159 Encounter for screening for other viral diseases: Secondary | ICD-10-CM | POA: Diagnosis not present

## 2018-10-27 DIAGNOSIS — I2721 Secondary pulmonary arterial hypertension: Secondary | ICD-10-CM | POA: Diagnosis present

## 2018-10-27 DIAGNOSIS — G4700 Insomnia, unspecified: Secondary | ICD-10-CM | POA: Diagnosis present

## 2018-10-27 DIAGNOSIS — F332 Major depressive disorder, recurrent severe without psychotic features: Principal | ICD-10-CM | POA: Diagnosis present

## 2018-10-27 DIAGNOSIS — F410 Panic disorder [episodic paroxysmal anxiety] without agoraphobia: Secondary | ICD-10-CM | POA: Diagnosis present

## 2018-10-27 DIAGNOSIS — Z7951 Long term (current) use of inhaled steroids: Secondary | ICD-10-CM | POA: Diagnosis not present

## 2018-10-27 MED ORDER — ACETAMINOPHEN 325 MG PO TABS
650.0000 mg | ORAL_TABLET | Freq: Four times a day (QID) | ORAL | Status: DC | PRN
  Administered 2018-10-29: 650 mg via ORAL
  Filled 2018-10-27: qty 2

## 2018-10-27 MED ORDER — MAGNESIUM HYDROXIDE 400 MG/5ML PO SUSP: 30.0000 mL | Freq: Every day | ORAL | Status: DC | PRN

## 2018-10-27 MED ORDER — ALUM & MAG HYDROXIDE-SIMETH 200-200-20 MG/5ML PO SUSP: 30.0000 mL | ORAL | Status: DC | PRN
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
## 2018-10-27 NOTE — Progress Notes (Deleted)
Adult Psychoeducational Group Note  Date:  10/27/2018 Time:  10:46 PM  Group Topic/Focus:  Wrap-Up Group:   The focus of this group is to help patients review their daily goal of treatment and discuss progress on daily workbooks.  Participation Level:  Active  Participation Quality:  Appropriate  Affect:  Appropriate  Cognitive:  Appropriate  Insight: Appropriate  Engagement in Group:  Engaged  Modes of Intervention:  Discussion  Additional Comments:  Patient attended group and participated.  Rickardo Brinegar W Okie Jansson 4/51/4604, 10:46 PM

## 2018-10-27 NOTE — H&P (Signed)
Behavioral Health Medical Screening Exam  Jon Reyes is an 58 y.o. male.  Total Time spent with patient: 15 minutes  Psychiatric Specialty Exam: Physical Exam  Constitutional: He is oriented to person, place, and time. He appears well-developed and well-nourished. No distress.  HENT:  Head: Normocephalic and atraumatic.  Right Ear: External ear normal.  Left Ear: External ear normal.  Eyes: Pupils are equal, round, and reactive to light. Right eye exhibits no discharge. Left eye exhibits no discharge. No scleral icterus.  Respiratory: Effort normal. No respiratory distress.  Musculoskeletal: Normal range of motion.  Neurological: He is alert and oriented to person, place, and time.  Skin: He is not diaphoretic.  Psychiatric: He is not withdrawn and not actively hallucinating. Thought content is not paranoid and not delusional. He exhibits a depressed mood. He expresses suicidal ideation. He expresses no homicidal ideation. He expresses suicidal plans.    Review of Systems  Constitutional: Negative for chills, diaphoresis, fever, malaise/fatigue and weight loss.  Respiratory: Negative for cough and shortness of breath.   Cardiovascular: Negative for chest pain.  Gastrointestinal: Negative for diarrhea, nausea and vomiting.  Psychiatric/Behavioral: Positive for depression and suicidal ideas. Negative for hallucinations, memory loss and substance abuse. The patient is nervous/anxious and has insomnia.     Blood pressure 131/89, pulse 90, temperature 98.6 F (37 C), temperature source Oral, resp. rate 17.There is no height or weight on file to calculate BMI.  General Appearance: Casual  Eye Contact:  Fair  Speech:  Clear and Coherent and Normal Rate  Volume:  Decreased  Mood:  Depressed and Hopeless  Affect:  Congruent and Depressed  Thought Process:  Coherent, Goal Directed and Descriptions of Associations: Intact  Orientation:  Full (Time, Place, and Person)  Thought Content:   Logical and Hallucinations: None  Suicidal Thoughts:  Yes.  with intent/plan  Homicidal Thoughts:  No  Memory:  Immediate;   Good Recent;   Good  Judgement:  Impaired  Insight:  Lacking  Psychomotor Activity:  Normal  Concentration: Concentration: Good and Attention Span: Good  Recall:  Good  Fund of Knowledge:Good  Language: Good  Akathisia:  Negative  Handed:  Right  AIMS (if indicated):     Assets:  Communication Skills Desire for Improvement Financial Resources/Insurance Housing Intimacy Leisure Time Physical Health  Sleep:       Musculoskeletal: Strength & Muscle Tone: within normal limits Gait & Station: normal   Blood pressure 131/89, pulse 90, temperature 98.6 F (37 C), temperature source Oral, resp. rate 17.  Recommendations:  Based on my evaluation the patient does not appear to have an emergency medical condition.  Rozetta Nunnery, NP 10/27/2018, 11:46 PM

## 2018-10-28 ENCOUNTER — Other Ambulatory Visit: Payer: Self-pay

## 2018-10-28 ENCOUNTER — Encounter (HOSPITAL_COMMUNITY): Payer: Self-pay

## 2018-10-28 DIAGNOSIS — F332 Major depressive disorder, recurrent severe without psychotic features: Principal | ICD-10-CM

## 2018-10-28 LAB — HEMOGLOBIN A1C
Hgb A1c MFr Bld: 6.1 % — ABNORMAL HIGH (ref 4.8–5.6)
Mean Plasma Glucose: 128.37 mg/dL

## 2018-10-28 LAB — COMPREHENSIVE METABOLIC PANEL
ALT: 13 U/L (ref 0–44)
AST: 14 U/L — ABNORMAL LOW (ref 15–41)
Albumin: 3.4 g/dL — ABNORMAL LOW (ref 3.5–5.0)
Alkaline Phosphatase: 86 U/L (ref 38–126)
Anion gap: 9 (ref 5–15)
BUN: 12 mg/dL (ref 6–20)
CO2: 26 mmol/L (ref 22–32)
Calcium: 8.9 mg/dL (ref 8.9–10.3)
Chloride: 104 mmol/L (ref 98–111)
Creatinine, Ser: 0.83 mg/dL (ref 0.61–1.24)
GFR calc Af Amer: >60 mL/min (ref >60)
GFR calc non Af Amer: >60 mL/min (ref >60)
Glucose, Bld: 90 mg/dL (ref 70–99)
Potassium: 4.3 mmol/L (ref 3.5–5.1)
Sodium: 139 mmol/L (ref 135–145)
Total Bilirubin: 0.2 mg/dL — ABNORMAL LOW (ref 0.3–1.2)
Total Protein: 7.2 g/dL (ref 6.5–8.1)

## 2018-10-28 LAB — LIPID PANEL
Cholesterol: 170 mg/dL (ref 0–200)
HDL: 46 mg/dL (ref >40)
LDL Cholesterol: 114 mg/dL — ABNORMAL HIGH (ref 0–99)
Total CHOL/HDL Ratio: 3.7 RATIO
Triglycerides: 50 mg/dL (ref <150)
VLDL: 10 mg/dL (ref 0–40)

## 2018-10-28 LAB — CBC
HCT: 41.2 % (ref 39.0–52.0)
Hemoglobin: 12.7 g/dL — ABNORMAL LOW (ref 13.0–17.0)
MCH: 29.3 pg (ref 26.0–34.0)
MCHC: 30.8 g/dL (ref 30.0–36.0)
MCV: 94.9 fL (ref 80.0–100.0)
Platelets: 311 10*3/uL (ref 150–400)
RBC: 4.34 MIL/uL (ref 4.22–5.81)
RDW: 12.5 % (ref 11.5–15.5)
WBC: 5.1 10*3/uL (ref 4.0–10.5)
nRBC: 0 % (ref 0.0–0.2)

## 2018-10-28 LAB — SARS CORONAVIRUS 2 BY RT PCR (HOSPITAL ORDER, PERFORMED IN ~~LOC~~ HOSPITAL LAB): SARS Coronavirus 2: NEGATIVE

## 2018-10-28 LAB — TSH: TSH: 0.634 u[IU]/mL (ref 0.350–4.500)

## 2018-10-28 MED ORDER — VORTIOXETINE HBR 20 MG PO TABS
20.0000 mg | ORAL_TABLET | Freq: Every day | ORAL | Status: DC
  Administered 2018-10-28 – 2018-10-29 (×2): 20 mg via ORAL
  Filled 2018-10-28 (×5): qty 1

## 2018-10-28 MED ORDER — ANIMAL SHAPES WITH C & FA PO CHEW
1.0000 | CHEWABLE_TABLET | Freq: Every day | ORAL | Status: DC
  Administered 2018-10-29: 1 via ORAL
  Filled 2018-10-28 (×3): qty 1

## 2018-10-28 MED ORDER — SERTRALINE HCL 20 MG/ML PO CONC: 50.0000 mg | Freq: Every day | ORAL | Status: DC

## 2018-10-28 MED ORDER — COMPLETENATE 29-1 MG PO CHEW
1.0000 | CHEWABLE_TABLET | Freq: Every day | ORAL | Status: DC
  Filled 2018-10-28 (×2): qty 1

## 2018-10-28 MED ORDER — TEMAZEPAM 15 MG PO CAPS
30.0000 mg | ORAL_CAPSULE | Freq: Every day | ORAL | Status: DC
  Administered 2018-10-28: 30 mg via ORAL
  Filled 2018-10-28: qty 2

## 2018-10-28 MED ORDER — TEMAZEPAM 15 MG PO CAPS
15.0000 mg | ORAL_CAPSULE | Freq: Every evening | ORAL | Status: DC | PRN
  Administered 2018-10-28: 15 mg via ORAL
  Filled 2018-10-28: qty 1

## 2018-10-28 MED ORDER — HYDROXYZINE HCL 25 MG PO TABS: 25.0000 mg | ORAL_TABLET | Freq: Four times a day (QID) | ORAL | Status: DC | PRN

## 2018-10-28 NOTE — Progress Notes (Signed)
Psychoeducational Group Note  Date:  10/28/2018 Time: 2030 Group Topic/Focus:  wrap up group  Participation Level: Did Not Attend  Participation Quality:  Not Applicable  Affect:  Not Applicable  Cognitive:  Not Applicable  Insight:  Not Applicable  Engagement in Group: Not Applicable  Additional Comments:  Pt was notified that group was beginning but remained in bed.   Shellia Cleverly 10/28/2018, 10:25 PM
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
## 2018-10-28 NOTE — Progress Notes (Signed)
Admission Note:  D:58 yr male who presents [facility] in no acute distress for the treatment of SI and Depression. Pt appears flat and depressed. Pt was calm and cooperative with admission process. Pt presents with passive SI and contracts for safety upon admission. Pt denies AVH .  Pt stated he has been decompensating since the death of his son this year, pt came in on the request of his PCP and Therapist. Pt said he has been SI- plan to hang himself or OD with pills and ETOH.    A:Skin was assessed and found to be clear of any abnormal marks apart from old scars on forearms. PT searched and no contraband found, POC and unit policies explained and understanding verbalized. Consents obtained. Food and fluids offered, and accepted.  R: Pt had no additional questions or concerns.
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
## 2018-10-28 NOTE — Plan of Care (Signed)
D: Patient is in bed sleeping on approach. He is drowsy during assessment by this RN. Patient is cooperative. Denies SI, HI, AVH, and verbally contracts for safety. Patient denies physical symptoms/pain.    A: Medications administered per MD order. Support provided. Patient educated on safety on the unit and medications. Routine safety checks every 15 minutes. Patient stated understanding to tell nurse about any new physical symptoms. Patient understands to tell staff of any needs.     R: No adverse drug reactions noted. Patient verbally contracts for safety. Patient remains safe at this time and will continue to monitor.   Problem: Education: Goal: Knowledge of [facility] Education information/materials will improve Outcome: Progressing   Patient is oriented to the unit. Patient remains safe and will continue to monitor.   [place] NOVEL CORONAVIRUS (COVID-19) DAILY CHECK-OFF SYMPTOMS - answer yes or no to each - every day NO YES  Have you had a fever in the past 24 hours?  Fever (Temp > 37.80C / 100F) X   Have you had any of these symptoms in the past 24 hours? New Cough  Sore Throat   Shortness of Breath  Difficulty Breathing  Unexplained Body Aches   X   Have you had any one of these symptoms in the past 24 hours not related to allergies?   Runny Nose  Nasal Congestion  Sneezing   X   If you have had runny nose, nasal congestion, sneezing in the past 24 hours, has it worsened?  X   EXPOSURES - check yes or no X   Have you traveled outside the state in the past 14 days?  X   Have you been in contact with someone with a confirmed diagnosis of COVID-19 or PUI in the past 14 days without wearing appropriate PPE?  X   Have you been living in the same home as a person with confirmed diagnosis of COVID-19 or a PUI (household contact)?    X   Have you been diagnosed with COVID-19?    X              What to do next: Answered NO to all: Answered YES to anything:    Proceed with unit schedule Follow the [facility] Inpatient Flowsheet.
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
## 2018-10-29 LAB — RAPID URINE DRUG SCREEN, HOSP PERFORMED
Amphetamines: NOT DETECTED
Barbiturates: NOT DETECTED
Benzodiazepines: POSITIVE — AB
Cocaine: NOT DETECTED
Opiates: NOT DETECTED
Tetrahydrocannabinol: POSITIVE — AB

## 2018-10-29 MED ORDER — TEMAZEPAM 30 MG PO CAPS: 30.0000 mg | ORAL_CAPSULE | Freq: Every day | ORAL | 1 refills | Status: AC

## 2018-10-29 MED ORDER — VORTIOXETINE HBR 20 MG PO TABS: 20.0000 mg | ORAL_TABLET | Freq: Every day | ORAL | 1 refills | Status: DC

## 2018-10-29 NOTE — BHH Suicide Risk Assessment (Signed)
[place] INPATIENT:  Family/Significant Other Suicide Prevention Education  Suicide Prevention Education:  Contact Attempts: with fiance, Doloris Hall [phone]  has been identified by the patient as the family member/significant other with whom the patient will be residing, and identified as the person(s) who will aid the patient in the event of a mental health crisis.  With written consent from the patient, two attempts were made to provide suicide prevention education, prior to and/or following the patient's discharge.  We were unsuccessful in providing suicide prevention education.  A suicide education pamphlet was given to the patient to share with family/significant other.  Date and time of first attempt: 10/29/2018 at 12:27pm. Left a detailed HIPAA compliant voicemail with callback information. Date and time of second attempt: to be attempted at a later time  Joellen Jersey 10/29/2018, 12:29 PM

## 2018-10-29 NOTE — BHH Suicide Risk Assessment (Signed)
[facility] Discharge Suicide Risk Assessment   Principal Problem: Severity of depression Discharge Diagnoses: Active Problems:   Severe recurrent major depression without psychotic features ([place])   Total Time spent with patient: 45 minutes  Musculoskeletal: Strength & Muscle Tone: within normal limits Gait & Station: normal Patient leans: N/A  Psychiatric Specialty Exam: ROS  Blood pressure (!) 126/102, pulse 93, temperature 98.7 F (37.1 C), temperature source Oral, resp. rate 18, height 5\' 7"  (1.702 m), weight 69.5 kg, SpO2 100 %.Body mass index is 24 kg/m.  General Appearance: Casual  Eye Contact::  Fair  Speech:  Clear and Coherent409  Volume:  Normal  Mood:  Dysphoric  Affect:  Congruent and Constricted  Thought Process:  Linear and Descriptions of Associations: Intact  Orientation:  Full (Time, Place, and Person)  Thought Content:  Tangential  Suicidal Thoughts:  No  Homicidal Thoughts:  No  Memory:  Immediate;   Fair  Judgement:  Fair  Insight:  Fair  Psychomotor Activity:  Normal  Concentration:  Good  Recall:  Good  Fund of Knowledge:Good  Language: Good  Akathisia:  Negative  Handed:  Right  AIMS (if indicated):     Assets:  Communication Skills Desire for Improvement Leisure Time Physical Health  Sleep:  Number of Hours: 3.5  Cognition: WNL  ADL's:  Intact   Mental Status Per Nursing Assessment::   On Admission:  Suicidal ideation indicated by patient, Self-harm thoughts, Suicide plan  Demographic Factors:  Male  Loss Factors: Loss of significant relationship  Historical Factors: NA  Risk Reduction Factors:   Sense of responsibility to family and Religious beliefs about death  Continued Clinical Symptoms:  Alcohol/Substance Abuse/Dependencies  Cognitive Features That Contribute To Risk:  None    Suicide Risk:  Minimal: No identifiable suicidal ideation.  Patients presenting with no risk factors but with morbid ruminations; may be classified  as minimal risk based on the severity of the depressive symptoms    Plan Of Care/Follow-up recommendations:  Activity:  full  Clinten Howk, MD 10/29/2018, 11:21 AM

## 2018-10-29 NOTE — Progress Notes (Signed)
  [facility] Adult Case Management Discharge Plan :  Will you be returning to the same living situation after discharge:  Yes,  home At discharge, do you have transportation home?: Yes,  fiance will pick up Do you have the ability to pay for your medications: Yes,  [facility] insurance  Release of information consent forms completed and in the chart.  Patient to Follow up at: Follow-up Information    [facility]PA Counsel[facility] Follow up.   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       [facility] Follow up on 11/02/2018.   Why: Virtual medication management appointment is Tuesday, 6/30 at 9:00a.  The provider will contact you for appointment.  Contact information: [place] [address] [phone]           Next level of care provider has access to [place] and Suicide Prevention discussed: Yes,  with patient. Tried to reach fiance twice.,  Have you used any form of tobacco in the last 30 days? (Cigarettes, Smokeless Tobacco, Cigars, and/or Pipes): No  Has patient been referred to the Quitline?: N/A patient is not a smoker  Patient has been referred for addiction treatment: Yes  Joellen Jersey, [place] 10/29/2018, 1:35 PM
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
## 2018-10-29 NOTE — Progress Notes (Signed)
Patient ID: Jon Reyes, male   DOB: [birth year], 58 y.o.   MRN: [id number]   Discharge Note  Patient denies SI/HI and states readiness for discharge.  Written and verbal discharge instructions reviewed with the patient. Patient accepting to information and verbalized understanding with no concerns. All belongings returned to patient from the unit and secured lockers. Patient has completed their Suicide Safety Plan and has been provided Suicide Prevention resources. Patient provided an opportunity to complete and return Patient Satisfaction Survey.   Patient was safely escorted to the lobby for discharge. Patient discharged from [facility] prescriptions, personal belongings, follow-up appointment in place and discharge paperwork.
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
## 2018-10-29 NOTE — Discharge Summary (Signed)
Physician Discharge Summary Note  Patient:  Jon Reyes is an 58 y.o., male MRN:  [id number] DOB:  1960-12-11 Patient phone:  [phone] (home)  Patient address:   [address] [place] [address],  Total Time spent with patient: 45 minutes  Date of Admission:  10/27/2018 Date of Discharge: 10/29/2018  Reason for Admission:   Jon Reyes is a 58 year old patient who reports severe depression and presented himself due to concerns about safety, he lost his son and shooting and understandably has unresolved grief, severe depression his son was killed 6 weeks ago. Curiously, this patient had witnessed a murder when he was 58 years of age.  At any rate his own EAP therapist who brought him in for evaluation once he expressed thoughts of harming himself.  The patient states he used to drink to try and self medicate but has not had any alcohol in 3 weeks he states he does not require detox measures.  His history is consistent.  According to our assessment team  Jon Reyes an 58 y.o.male. -Patient came to [facility] at the urging of his fiance and his EAP counselor. Patient is unaccompanied during assessment.  Patient's 62 year old son was shot and killed in [place] on 09/16/18. Patient said that he had spent time with him the previous week. Pt shared that he witnessed a murder when he was 58 years old. This experience has brought up those memories also.   Patient has four remaining children. Some are in [place] while others are in [place]. Patient says that he lives with his fiance currently.  Patient told his EAP therapist that he has been having thoughts off and on of killing himself. He admitted that he went into the attic to look for a beam from which to hang himself. He also has had thoughts of overdosing on medications and "going to sleep and never waking up." He says he had one previous attempt years ago.  Patient has thoughts of killing the man that shot his  son. He has no plan or intention at this time however.  Patient denies any A/V hallucinations. He reported to FNP, Lindon Romp that he had not had any ETOH in three weeks.  Patient says that he has not slept more than 4-5 hours per day on average for weeks. He also has lost about 15 lbs. Patient says he has been isolating himself.  Patient is goal oriented in his speech. He has a depressed affect. He has been having nightmares and panic attacks.  Patient has psychiatric meds which are administered by his PCP w/ [facility]. Patient only has his EAP counselor for now. He works at [employer]. No previous inpatient care.  -Clinician discussed patient care with Lindon Romp, FNP who recommends inpatient psychiatric care. Patient has been accepted to [facility] to Dr. Parke Poisson. Patient has signed his voluntary admission   Principal Problem: <principal problem not specified> Discharge Diagnoses: Active Problems:   Severe recurrent major depression without psychotic features [facility])     Past Medical History:  Past Medical History:  Diagnosis Date  . Allergy    nasal congestion recently. Seeing allergist tomorrow.  . Asthma   . Depression   . Hemorrhoids   . Hyperthyroidism 02/28/2015    Past Surgical History:  Procedure Laterality Date  . ABDOMINAL HERNIA REPAIR    . HEMORRHOID SURGERY    . TOE AMPUTATION     right foot   Family History:  Family History  Problem Relation  Age of Onset  . Heart disease Mother   . Heart disease Father   . Thyroid disease Neg Hx     Social History:  Social History   Substance and Sexual Activity  Alcohol Use Yes   Comment: very rare seldom.     Social History   Substance and Sexual Activity  Drug Use Yes  . Types: Marijuana    Social History   Socioeconomic History  . Marital status: Single    Spouse name: Not on file  . Number of children: Not on file  . Years of education: Not on file  . Highest education level: Not on file   Occupational History  . Not on file  Social Needs  . Financial resource strain: Not on file  . Food insecurity    Worry: Not on file    Inability: Not on file  . Transportation needs    Medical: Not on file    Non-medical: Not on file  Tobacco Use  . Smoking status: Never Smoker  . Smokeless tobacco: Never Used  Substance and Sexual Activity  . Alcohol use: Yes    Comment: very rare seldom.  . Drug use: Yes    Types: Marijuana  . Sexual activity: Yes    Birth control/protection: None  Lifestyle  . Physical activity    Days per week: Not on file    Minutes per session: Not on file  . Stress: Not on file  Relationships  . Social Herbalist on phone: Not on file    Gets together: Not on file    Attends religious service: Not on file    Active member of club or organization: Not on file    Attends meetings of clubs or organizations: Not on file    Relationship status: Not on file  Other Topics Concern  . Not on file  Social History Narrative  . Not on file    Hospital Course:    Here the patient complied he was somewhat withdrawn initially but he did participate, his antidepressant was switched to a more potent version.  He did request discharge by the date of the 26, we felt it was a rather quick turnaround he still had some dysphoria but he was alert and fully oriented and denied thoughts of harming himself those have dissipated he could contract fully and insisted that if he got to that point again he would pick up the phone and come in here he was contracting fully had no access to weapons no psychosis and did seem stable he is not had anything to drink in over 3 weeks was not in any withdrawal so he was discharged at his request, no access to weapons and again contracting fully with a report of a resolved mindset, no suicidal thinking plans or intent  Physical Findings: AIMS: Facial and Oral Movements Muscles of Facial Expression: None, normal Lips and  Perioral Area: None, normal Jaw: None, normal Tongue: None, normal,Extremity Movements Upper (arms, wrists, hands, fingers): None, normal Lower (legs, knees, ankles, toes): None, normal, Trunk Movements Neck, shoulders, hips: None, normal, Overall Severity Severity of abnormal movements (highest score from questions above): None, normal Incapacitation due to abnormal movements: None, normal Patient's awareness of abnormal movements (rate only patient's report): No Awareness, Dental Status Current problems with teeth and/or dentures?: No Does patient usually wear dentures?: No  CIWA:  CIWA-Ar Total: 0 COWS:  COWS Total Score: 1  Musculoskeletal: Strength & Muscle Tone: within  normal limits Gait & Station: normal Patient leans: N/A  Psychiatric Specialty Exam: ROS  Blood pressure (!) 126/102, pulse 93, temperature 98.7 F (37.1 C), temperature source Oral, resp. rate 18, height 5\' 7"  (1.702 m), weight 69.5 kg, SpO2 100 %.Body mass index is 24 kg/m.  General Appearance: Casual  Eye Contact::  Fair  Speech:  Clear and Coherent409  Volume:  Normal  Mood:  Dysphoric  Affect:  Congruent and Constricted  Thought Process:  Linear and Descriptions of Associations: Intact  Orientation:  Full (Time, Place, and Person)  Thought Content:  Tangential  Suicidal Thoughts:  No  Homicidal Thoughts:  No  Memory:  Immediate;   Fair  Judgement:  Fair  Insight:  Fair  Psychomotor Activity:  Normal  Concentration:  Good  Recall:  Good  Fund of Knowledge:Good  Language: Good  Akathisia:  Negative  Handed:  Right  AIMS (if indicated):     Assets:  Communication Skills Desire for Improvement Leisure Time Physical Health  Sleep:  Number of Hours: 3.5  Cognition: WNL  ADL's:  Intact    Have you used any form of tobacco in the last 30 days? (Cigarettes, Smokeless Tobacco, Cigars, and/or Pipes): No  Has this patient used any form of tobacco in the last 30 days? (Cigarettes, Smokeless  Tobacco, Cigars, and/or Pipes) Yes, No  Blood Alcohol level:  No results found for: [facility]  Metabolic Disorder Labs:  Lab Results  Component Value Date   HGBA1C 6.1 (H) 10/28/2018   MPG 128.37 10/28/2018   No results found for: PROLACTIN Lab Results  Component Value Date   CHOL 170 10/28/2018   TRIG 50 10/28/2018   HDL 46 10/28/2018   CHOLHDL 3.7 10/28/2018   VLDL 10 10/28/2018   LDLCALC 114 (H) 10/28/2018   LDLCALC 120 (H) 07/15/2017    See Psychiatric Specialty Exam and Suicide Risk Assessment completed by Attending Physician prior to discharge.  Discharge destination:  Home  Is patient on multiple antipsychotic therapies at discharge:  No   Has Patient had three or more failed trials of antipsychotic monotherapy by history:  No  Recommended Plan for Multiple Antipsychotic Therapies: NA   Allergies as of 10/29/2018   No Known Allergies     Medication List    STOP taking these medications   ciprofloxacin 500 MG tablet Commonly known as: CIPRO   clonazePAM 0.5 MG tablet Commonly known as: KLONOPIN   metroNIDAZOLE 500 MG tablet Commonly known as: FLAGYL   sertraline 25 MG tablet Commonly known as: ZOLOFT   sertraline 50 MG tablet Commonly known as: ZOLOFT   traZODone 50 MG tablet Commonly known as: DESYREL     TAKE these medications     Indication  fluticasone 50 MCG/ACT nasal spray Commonly known as: FLONASE Place 2 sprays into both nostrils daily.  Indication: Signs and Symptoms of Nose Diseases   sildenafil 20 MG tablet Commonly known as: REVATIO Take 1 tablet (20 mg total) by mouth 1 day or 1 dose. Take 2-4 tablets daily as needed. NO MORE THAN 4 TABLETS IN A 24 HOUR PERIOD.  Indication: Pulmonary Arterial Hypertension   temazepam 30 MG capsule Commonly known as: RESTORIL Take 1 capsule (30 mg total) by mouth at bedtime.  Indication: Trouble Sleeping   vortioxetine HBr 20 MG Tabs tablet Commonly known as: TRINTELLIX Take 1 tablet (20 mg  total) by mouth daily. If not covered substitute sertraline 100 mg a day 30 1 R Start taking on: October 30, 2018  Indication: Major Depressive Disorder       Signed: Johnn Hai, MD 10/29/2018, 11:30 AM

## 2018-10-29 NOTE — [facility] Suicide Risk Assessment (Signed)
[facility] INPATIENT:  Family/Significant Other Suicide Prevention Education  Suicide Prevention Education:  Contact Attempts: with fiance, Doloris Hall [phone]  has been identified by the patient as the family member/significant other with whom the patient will be residing, and identified as the person(s) who will aid the patient in the event of a mental health crisis.  With written consent from the patient, two attempts were made to provide suicide prevention education, prior to and/or following the patient's discharge.  We were unsuccessful in providing suicide prevention education.  A suicide education pamphlet was given to the patient to share with family/significant other.  Date and time of first attempt: 10/29/2018 at 12:27pm. Left a detailed HIPAA compliant voicemail with callback information. Date and time of second attempt: 10/29/2018 at 1:15pm.  Joellen Jersey 10/29/2018, 1:17 PM

## 2018-11-08 ENCOUNTER — Other Ambulatory Visit: Payer: Self-pay

## 2018-11-08 ENCOUNTER — Ambulatory Visit: Payer: 59 | Admitting: Medical

## 2018-11-08 ENCOUNTER — Encounter: Payer: Self-pay | Admitting: Medical

## 2018-11-08 ENCOUNTER — Telehealth: Payer: Self-pay | Admitting: Medical

## 2018-11-08 ENCOUNTER — Ambulatory Visit (INDEPENDENT_AMBULATORY_CARE_PROVIDER_SITE_OTHER): Payer: 59 | Admitting: Medical

## 2018-11-08 VITALS — BP 120/86 | Ht 68.0 in | Wt 147.6 lb

## 2018-11-08 DIAGNOSIS — F329 Major depressive disorder, single episode, unspecified: Secondary | ICD-10-CM | POA: Diagnosis not present

## 2018-11-08 DIAGNOSIS — G4700 Insomnia, unspecified: Secondary | ICD-10-CM | POA: Diagnosis not present

## 2018-11-08 DIAGNOSIS — F419 Anxiety disorder, unspecified: Secondary | ICD-10-CM

## 2018-11-08 DIAGNOSIS — F32A Depression, unspecified: Secondary | ICD-10-CM

## 2018-11-08 NOTE — Progress Notes (Signed)
Subjective:    Patient ID: Jon Reyes, male    DOB: 01/16/1961, 58 y.o.   MRN: [id number]  HPI  Virtual Visit via Video Note  I connected with Jon Reyes on 11/08/18 at  1:40 PM EDT by a video enabled telemedicine application and verified that I am speaking with the correct person using two identifiers.  Location: Patient: home  Provider: office   I discussed the limitations of evaluation and management by telemedicine and the availability of in person appointments. The patient expressed understanding and agreed to proceed.  History of Present Illness:  Pt is currently seeing both counselor and psychiatrist. Pt was on sertraline and abilify. Pt still states his mood is up and down. He is taking abilify in am and sertraline later in afternoon. Last night he had trouble sleeping.  Pt states he is more forgetful recently.  Pt states he has short term disability for short term disability. He feels has memory issues presently that would be barrier to work.  Still depressed and anxious. No constant thought of harm to self. If has thought very fleeting, does not entertain thoughts and no plans. No current type thoughts.  Pt has adopted his sons dog. Taking care of him.  Pt started his abilify on the November 02, 2018.  Pt psychiatrist office [phone].(pt has appoitment with August 5 th)  Pt also states counselor is filling out paperwork. Gwenn Borcyk. She is filling out paperwork fax  [phone].   [phone]  Pt also saw [facility]- [phone].      Observations/Objective: General-no acute distress, pleasant, oriented. Lungs- on inspection lungs appear unlabored. Neck- no tracheal deviation or jvd on inspection. Neuro- gross motor function appears intact.   Assessment and Plan: For depression and anxiety, I want you to continue abilify and sertraline. I think you will get feel some better as abilify takes some weeks to take full effect.   Pt  aware/educated by myself and hospital staff on going to ED at [facility] in event of persisting  thought of harm to self or others. Pt again expressed understanding.  Work not written asking for more time for forms to be filled out as I think psychiatrist best person to fill out fairly complex form. In additions he will be missing various weeks.  Follow up in 3 weeks or as needed  Mackie Pai, PA-C  Follow Up Instructions:    I discussed the assessment and treatment plan with the patient. The patient was provided an opportunity to ask questions and all were answered. The patient agreed with the plan and demonstrated an understanding of the instructions.   The patient was advised to call back or seek an in-person evaluation if the symptoms worsen or if the condition fails to improve as anticipated.  I provided 15 minutes of non-face-to-face time during this encounter.   Mackie Pai, PA-C    Review of Systems  Constitutional: Negative for chills, fatigue and fever.  Respiratory: Negative for cough, chest tightness, shortness of breath and wheezing.   Cardiovascular: Negative for chest pain and palpitations.  Gastrointestinal: Negative for abdominal pain.  Genitourinary: Negative for dysuria and frequency.  Skin: Negative for rash.  Neurological: Negative for dizziness.  Hematological: Negative for adenopathy. Does not bruise/bleed easily.  Psychiatric/Behavioral: Positive for dysphoric mood. Negative for behavioral problems, sleep disturbance and suicidal ideas. The patient is nervous/anxious.        Fleeting thought of harm to self. But not plans or intentions.  Past Medical History:  Diagnosis Date  . Allergy    nasal congestion recently. Seeing allergist tomorrow.  . Asthma   . Depression   . Hemorrhoids   . Hyperthyroidism 02/28/2015     Social History   Socioeconomic History  . Marital status: Single    Spouse name: Not on file  . Number of children: Not on  file  . Years of education: Not on file  . Highest education level: Not on file  Occupational History  . Not on file  Social Needs  . Financial resource strain: Not on file  . Food insecurity    Worry: Not on file    Inability: Not on file  . Transportation needs    Medical: Not on file    Non-medical: Not on file  Tobacco Use  . Smoking status: Never Smoker  . Smokeless tobacco: Never Used  Substance and Sexual Activity  . Alcohol use: Yes    Comment: very rare seldom.  . Drug use: Yes    Types: Marijuana  . Sexual activity: Yes    Birth control/protection: None  Lifestyle  . Physical activity    Days per week: Not on file    Minutes per session: Not on file  . Stress: Not on file  Relationships  . Social Herbalist on phone: Not on file    Gets together: Not on file    Attends religious service: Not on file    Active member of club or organization: Not on file    Attends meetings of clubs or organizations: Not on file    Relationship status: Not on file  . Intimate partner violence    Fear of current or ex partner: Not on file    Emotionally abused: Not on file    Physically abused: Not on file    Forced sexual activity: Not on file  Other Topics Concern  . Not on file  Social History Narrative  . Not on file    Past Surgical History:  Procedure Laterality Date  . ABDOMINAL HERNIA REPAIR    . HEMORRHOID SURGERY    . TOE AMPUTATION     right foot    Family History  Problem Relation Age of Onset  . Heart disease Mother   . Heart disease Father   . Thyroid disease Neg Hx     No Known Allergies  Current Outpatient Medications on File Prior to Visit  Medication Sig Dispense Refill  . fluticasone (FLONASE) 50 MCG/ACT nasal spray Place 2 sprays into both nostrils daily. 16 g 1  . sildenafil (REVATIO) 20 MG tablet Take 1 tablet (20 mg total) by mouth 1 day or 1 dose. Take 2-4 tablets daily as needed. NO MORE THAN 4 TABLETS IN A 24 HOUR PERIOD. 50  tablet 0  . temazepam (RESTORIL) 30 MG capsule Take 1 capsule (30 mg total) by mouth at bedtime. 30 capsule 1  . vortioxetine HBr (TRINTELLIX) 20 MG TABS tablet Take 1 tablet (20 mg total) by mouth daily. If not covered substitute sertraline 100 mg a day 30 1 R 30 tablet 1   No current facility-administered medications on file prior to visit.     BP 120/86   Ht 5\' 8"  (1.727 m)   Wt 147 lb 9.6 oz (67 kg)   BMI 22.44 kg/m       Objective:   Physical Exam        Assessment & Plan:

## 2018-11-08 NOTE — Patient Instructions (Addendum)
For depression and anxiety, I want you to continue abilify and sertraline. I think you will get feel some better as abilify takes some weeks to take full effect.   Pt aware/educated by myself and hospital staff on going to ED at [facility] in event of persisting  thought of harm to self or others. Pt again expressed understanding.  Work not written asking for more time for forms to be filled out as I think psychiatrist best person to fill out fairly complex form. In additions he will be missing various weeks.  Follow up in 3 weeks or as needed

## 2018-11-08 NOTE — Telephone Encounter (Signed)
Patient has moderate complex short-term disability form which I think is best filled out by his psychiatrist.  Patient is in a hospital admission and I think specialist doing this form is more appropriate.  Psychiatrist office number is [phone].  Would you call psychiatrist office and get the fax number.  Let them know that I reviewed the paperwork and think it will be best for psychiatrist to fill those out.  They are okay with that please fax over a copy of the form.  I document whether or not they agreed to fill out the form.  If they want them please let me know.  Thanks for your help.  I placed the forms on your desk.

## 2018-11-09 NOTE — Telephone Encounter (Signed)
Form faxed over today. 

## 2018-11-10 ENCOUNTER — Other Ambulatory Visit: Payer: Self-pay | Admitting: Medical

## 2018-11-11 ENCOUNTER — Encounter: Payer: Self-pay | Admitting: Medical

## 2018-11-13 ENCOUNTER — Encounter: Payer: Self-pay | Admitting: Medical

## 2018-11-13 NOTE — Telephone Encounter (Signed)
Rx refill generic viagra today.

## 2018-11-16 ENCOUNTER — Telehealth: Payer: Self-pay | Admitting: Medical

## 2018-11-16 NOTE — Telephone Encounter (Signed)
Will you get pt in for visit in morning wed, thurs, or Friday this week? He has some abdomen pain rt side/flank. Some pain urinating.

## 2018-11-17 ENCOUNTER — Other Ambulatory Visit: Payer: Self-pay

## 2018-11-18 ENCOUNTER — Ambulatory Visit: Payer: 59 | Admitting: Medical

## 2018-11-18 ENCOUNTER — Encounter: Payer: Self-pay | Admitting: Medical

## 2018-11-18 ENCOUNTER — Ambulatory Visit (HOSPITAL_BASED_OUTPATIENT_CLINIC_OR_DEPARTMENT_OTHER)
Admission: RE | Admit: 2018-11-18 | Discharge: 2018-11-18 | Disposition: A | Discharge status: Home / Self Care | Payer: 59 | Source: Ambulatory Visit | Attending: Medical | Admitting: Medical

## 2018-11-18 VITALS — BP 119/84 | HR 85 | Temp 98.2°F | Resp 16 | Ht 68.0 in | Wt 150.4 lb

## 2018-11-18 DIAGNOSIS — R0781 Pleurodynia: Secondary | ICD-10-CM

## 2018-11-18 DIAGNOSIS — R109 Unspecified abdominal pain: Secondary | ICD-10-CM | POA: Insufficient documentation

## 2018-11-18 DIAGNOSIS — R350 Frequency of micturition: Secondary | ICD-10-CM

## 2018-11-18 LAB — POC URINALSYSI DIPSTICK (AUTOMATED)
Bilirubin, UA: NEGATIVE
Blood, UA: NEGATIVE
Glucose, UA: NEGATIVE
Ketones, UA: NEGATIVE
Leukocytes, UA: NEGATIVE
Nitrite, UA: NEGATIVE
Protein, UA: NEGATIVE
Spec Grav, UA: 1.015 (ref 1.010–1.025)
Urobilinogen, UA: NEGATIVE E.U./dL — AB
pH, UA: 6 (ref 5.0–8.0)

## 2018-11-18 LAB — COMPREHENSIVE METABOLIC PANEL
ALT: 13 U/L (ref 0–53)
AST: 13 U/L (ref 0–37)
Albumin: 3.8 g/dL (ref 3.5–5.2)
Alkaline Phosphatase: 95 U/L (ref 39–117)
BUN: 8 mg/dL (ref 6–23)
CO2: 30 mEq/L (ref 19–32)
Calcium: 8.7 mg/dL (ref 8.4–10.5)
Chloride: 99 mEq/L (ref 96–112)
Creatinine, Ser: 0.74 mg/dL (ref 0.40–1.50)
GFR: 132.39 mL/min (ref >60.00)
Glucose, Bld: 85 mg/dL (ref 70–99)
Potassium: 4.1 mEq/L (ref 3.5–5.1)
Sodium: 136 mEq/L (ref 135–145)
Total Bilirubin: 0.2 mg/dL (ref 0.2–1.2)
Total Protein: 7.2 g/dL (ref 6.0–8.3)

## 2018-11-18 LAB — PSA: PSA: 0.53 ng/mL (ref 0.10–4.00)

## 2018-11-18 MED ORDER — CIPROFLOXACIN HCL 500 MG PO TABS: 500.0000 mg | ORAL_TABLET | Freq: Two times a day (BID) | ORAL | 0 refills | Status: DC

## 2018-11-18 NOTE — Progress Notes (Signed)
Subjective:    Patient ID: Jon Reyes, male    DOB: January 26, 1961, 58 y.o.   MRN: [id number]  HPI  Pt in with some rt flank/rt lower rib area pain for about over 2 weeks.   No groin area pain. He is urinating more frequentlly. Pain worse on rt side if he lays on that side. Pain low level constant in flank.  In lower rib area pain can worsening if breaths deep. No cough. No sob.  Pain level about 5/10.  No fever, no chills or sweats.   Review of Systems  Constitutional: Negative for chills, fatigue and fever.  HENT: Negative for congestion and drooling.   Respiratory: Negative for chest tightness, shortness of breath and wheezing.        Mild pleuritic pain on rt side.   Cardiovascular: Negative for chest pain and palpitations.  Gastrointestinal: Negative for abdominal pain, blood in stool, nausea and vomiting.  Genitourinary: Positive for frequency. Negative for dysuria.  Musculoskeletal: Negative for back pain and neck pain.       Rib and flank pain.  Skin: Negative for rash.       No rash. Skin not tender to light touch.  Neurological: Negative for facial asymmetry and headaches.  Hematological: Negative for adenopathy. Does not bruise/bleed easily.  Psychiatric/Behavioral: Positive for dysphoric mood. Negative for behavioral problems, confusion, self-injury and suicidal ideas. The patient is not nervous/anxious.        Mood recently improved. Less depressed. Fishing with son recently. States taken care of his deceased son dog helps.    Past Medical History:  Diagnosis Date  . Allergy    nasal congestion recently. Seeing allergist tomorrow.  . Asthma   . Depression   . Hemorrhoids   . Hyperthyroidism 02/28/2015     Social History   Socioeconomic History  . Marital status: Single    Spouse name: Not on file  . Number of children: Not on file  . Years of education: Not on file  . Highest education level: Not on file  Occupational History  . Not on file   Social Needs  . Financial resource strain: Not on file  . Food insecurity    Worry: Not on file    Inability: Not on file  . Transportation needs    Medical: Not on file    Non-medical: Not on file  Tobacco Use  . Smoking status: Never Smoker  . Smokeless tobacco: Never Used  Substance and Sexual Activity  . Alcohol use: Yes    Comment: very rare seldom.  . Drug use: Yes    Types: Marijuana  . Sexual activity: Yes    Birth control/protection: None  Lifestyle  . Physical activity    Days per week: Not on file    Minutes per session: Not on file  . Stress: Not on file  Relationships  . Social Herbalist on phone: Not on file    Gets together: Not on file    Attends religious service: Not on file    Active member of club or organization: Not on file    Attends meetings of clubs or organizations: Not on file    Relationship status: Not on file  . Intimate partner violence    Fear of current or ex partner: Not on file    Emotionally abused: Not on file    Physically abused: Not on file    Forced sexual activity: Not on file  Other Topics  Concern  . Not on file  Social History Narrative  . Not on file    Past Surgical History:  Procedure Laterality Date  . ABDOMINAL HERNIA REPAIR    . HEMORRHOID SURGERY    . TOE AMPUTATION     right foot    Family History  Problem Relation Age of Onset  . Heart disease Mother   . Heart disease Father   . Thyroid disease Neg Hx     No Known Allergies  Current Outpatient Medications on File Prior to Visit  Medication Sig Dispense Refill  . fluticasone (FLONASE) 50 MCG/ACT nasal spray Place 2 sprays into both nostrils daily. 16 g 1  . sildenafil (REVATIO) 20 MG tablet TAKE 2-4 TABLETS BY MOUTH DAILY AS NEEDED. TAKE NO MORE THAN 4 TABLETS IN A 24 HOUR PERIOD 50 tablet 0  . temazepam (RESTORIL) 30 MG capsule Take 1 capsule (30 mg total) by mouth at bedtime. 30 capsule 1  . vortioxetine HBr (TRINTELLIX) 20 MG TABS tablet  Take 1 tablet (20 mg total) by mouth daily. If not covered substitute sertraline 100 mg a day 30 1 R 30 tablet 1   No current facility-administered medications on file prior to visit.     BP 119/84   Pulse 85   Temp 98.2 F (36.8 C) (Oral)   Resp 16   Ht 5\' 8"  (1.727 m)   Wt 150 lb 6.4 oz (68.2 kg)   SpO2 100%   BMI 22.87 kg/m       Objective:   Physical Exam  General- No acute distress. Pleasant patient. Neck- Full range of motion, no jvd Lungs- Clear, even and unlabored. Heart- regular rate and rhythm. Neurologic- CNII- XII grossly intact.  Thorax- faint lower rt rib area tenderness. Mid axillary area. Abdomen- faint rt cva area tenderness.  Back- no cva tenderness.  Skin- no rash on inspection of thorax. No vesicles seen.       Assessment & Plan:  For recent rt flank pain with frequent urination, will get cmp, psa, ua and urine culture. You may have urinary tract infection or prostatitis. Will rx cipro antibiotic pending study results.  Also put in order for abd view xray. Will see if kidney stone seen.  For rt rib/pleuritic pain get cxr.  Also recommend alleve otc for pain and inflammation.  Follow up in 7 days or as needed  25 minutes spent with pt. 50% of time spent counseling on differntial dx, work up and plan going forward.   Mackie Pai, PA-C

## 2018-11-18 NOTE — Patient Instructions (Addendum)
For recent rt flank pain with frequent urination, will get cmp, psa, ua and urine culture. You may have urinary tract infection or prostatitis. Will rx cipro antibiotic pending study results.  Also put in order for abd view xray. Will see if kidney stone seen.  For rt rib/pleuritic pain get cxr.  Also recommend alleve otc for pain and inflammation.  Any increase in pain or change in sign/symptoms please let me know.  Follow up in 7 days or as needed

## 2018-11-19 LAB — URINE CULTURE
MICRO NUMBER:: 674237
Result:: NO GROWTH
SPECIMEN QUALITY:: ADEQUATE

## 2018-11-24 ENCOUNTER — Other Ambulatory Visit: Payer: Self-pay

## 2018-11-24 ENCOUNTER — Encounter: Payer: Self-pay | Admitting: Medical

## 2018-11-24 ENCOUNTER — Ambulatory Visit (INDEPENDENT_AMBULATORY_CARE_PROVIDER_SITE_OTHER): Payer: 59 | Admitting: Medical

## 2018-11-24 DIAGNOSIS — F32A Depression, unspecified: Secondary | ICD-10-CM

## 2018-11-24 DIAGNOSIS — R109 Unspecified abdominal pain: Secondary | ICD-10-CM | POA: Diagnosis not present

## 2018-11-24 DIAGNOSIS — F419 Anxiety disorder, unspecified: Secondary | ICD-10-CM

## 2018-11-24 DIAGNOSIS — F329 Major depressive disorder, single episode, unspecified: Secondary | ICD-10-CM

## 2018-11-24 MED ORDER — SERTRALINE HCL 100 MG PO TABS: 100.0000 mg | ORAL_TABLET | Freq: Every day | ORAL | 0 refills | Status: DC

## 2018-11-24 MED ORDER — VORTIOXETINE HBR 20 MG PO TABS: 20.0000 mg | ORAL_TABLET | Freq: Every day | ORAL | 1 refills | Status: DC

## 2018-11-24 MED ORDER — CIPROFLOXACIN HCL 500 MG PO TABS: 500.0000 mg | ORAL_TABLET | Freq: Two times a day (BID) | ORAL | 0 refills | Status: AC

## 2018-11-24 MED ORDER — SERTRALINE HCL 100 MG PO TABS: 100.0000 mg | ORAL_TABLET | Freq: Every day | ORAL | 0 refills | Status: AC

## 2018-11-24 MED ORDER — CIPROFLOXACIN HCL 500 MG PO TABS: 500.0000 mg | ORAL_TABLET | Freq: Two times a day (BID) | ORAL | 0 refills | Status: DC

## 2018-11-24 NOTE — Patient Instructions (Signed)
Your prior flank area pain has improved a lot with only minimal residual intermittent pain presently.  Your work-up was negative but it does appear that you are responding clinically to Cipro antibiotic.  We are approaching the end of the 7-day course of antibiotic and I do think it would be beneficial for you to take additional 3 days.  If you would send me a MyChart message in about 5 to 7 days and let me know if the pain has resolved completely.  If pain returns as previously then would likely get CT of abdomen and pelvis.  For history of depression and anxiety, would recommend that she stay on current regimen of sertraline, Abilify and temazepam.  You have transient thoughts of harm to self but much less than before and you report no formal plans.  Today is much better today than last week.  Gradually improving with counseling as well.  You aware to be seen by [facility] if serious/persisting thoughts of harm to self or others.  Continue with counseling and I refilled your sertraline today.  I think is best if you try to get the temazepam through the psychiatrist office.  Follow-up in 6 weeks or as needed.

## 2018-11-24 NOTE — Progress Notes (Signed)
Subjective:    Patient ID: Jon Reyes, male    DOB: 1960/10/02, 58 y.o.   MRN: [id number]  HPI  Virtual Visit via Telephone Note  I connected with Jon Reyes on 11/24/18 at  1:00 PM EDT by telephone and verified that I am speaking with the correct person using two identifiers.  Location: Patient: home Provider: home   I discussed the limitations, risks, security and privacy concerns of performing an evaluation and management service by telephone and the availability of in person appointments. I also discussed with the patient that there may be a patient responsible charge related to this service. The patient expressed understanding and agreed to proceed.   History of Present Illness: Pt states his flank pain has subsided a lot. He states at most pain is 2/10. Was more constant before I saw him and treated him. Pt urine was negative, xray abd was negative and labs were negative. PSA was negative and cmp was good.   Pt also saw counselor yesterday. He states overall doing better days. States has momentary moderate depression and sadness. But no severe depression with no constant suicdal ideations. Previous more constant. No plans. Pt tempted to drink but did not drink alcohol. Only smoked marijuana one time.  Pt states currently he is only on sertraline, temapazem 30 mg q hs.  Pt on  generic abilify.   Observations/Objective:  General -no acute distress, pleasant, no distress. Speech normal.  Assessment and Plan: Your prior flank area pain has improved a lot with only minimal residual intermittent pain presently.  Your work-up was negative but it does appear that you are responding clinically to Cipro antibiotic.  We are approaching the end of the 7-day course of antibiotic and I do think it would be beneficial for you to take additional 3 days.  If you would send me a MyChart message in about 5 to 7 days and let me know if the pain has resolved completely.  If pain  returns as previously then would likely get CT of abdomen and pelvis.  For history of depression and anxiety, would recommend that she stay on current regimen of sertraline, Abilify and temazepam.  You have transient thoughts of harm to self but much less than before and you report no formal plans.  Today is much better today than last week.  Gradually improving with counseling as well.  You aware to be seen by [facility] if serious/persisting thoughts of harm to self or others.  Continue with counseling and I refilled your sertraline today.  I think is best if you try to get the temazepam through the psychiatrist office.  Follow-up in 6 weeks or as needed.  Follow Up Instructions:    I discussed the assessment and treatment plan with the patient. The patient was provided an opportunity to ask questions and all were answered. The patient agreed with the plan and demonstrated an understanding of the instructions.   The patient was advised to call back or seek an in-person evaluation if the symptoms worsen or if the condition fails to improve as anticipated.  I provided 15  minutes of non-face-to-face time during this encounter.  Mackie Pai, PA-C   [employer], PA-C   Pt states his flank pain has subsided a lot. He states at most pain is 2/10. Was more constant before I saw him and treated him. Pt urine was negative, xray abd was negative and labs were negative. PSA was negative and cmp was good.  Pt also saw counselor yesterday. He states overall doing better days. States has momentary moderate depression and sadness. But no severe depression with no constant suicdal ideations. Previous more constant. No plans. Pt tempted to drink but did not drink alcohol. Only smoked marijuana one time.  Pt states currently he is only on sertraline, temapazem 30 mg q hs.  Pt on  generic abilify.     Review of Systems  Constitutional: Negative for chills, fatigue and fever.   Respiratory: Negative for cough, chest tightness, shortness of breath and wheezing.   Cardiovascular: Negative for chest pain and palpitations.  Gastrointestinal: Negative for abdominal pain.  Musculoskeletal: Negative for back pain.  Neurological: Negative for dizziness and numbness.  Hematological: Negative for adenopathy. Does not bruise/bleed easily.  Psychiatric/Behavioral: Positive for dysphoric mood. Negative for behavioral problems and suicidal ideas. The patient is nervous/anxious.        No present suicide ideations.       Objective:   Physical Exam        Assessment & Plan:

## 2018-12-16 ENCOUNTER — Encounter: Payer: Self-pay | Admitting: Medical

## 2018-12-21 ENCOUNTER — Encounter: Payer: Self-pay | Admitting: Medical

## 2018-12-23 ENCOUNTER — Ambulatory Visit (INDEPENDENT_AMBULATORY_CARE_PROVIDER_SITE_OTHER): Payer: 59 | Admitting: Family Medicine

## 2018-12-23 ENCOUNTER — Encounter: Payer: Self-pay | Admitting: Family Medicine

## 2018-12-23 ENCOUNTER — Other Ambulatory Visit: Payer: Self-pay

## 2018-12-23 VITALS — BP 132/80 | HR 81 | Temp 97.9°F | Resp 18 | Ht 68.0 in | Wt 152.0 lb

## 2018-12-23 DIAGNOSIS — R350 Frequency of micturition: Secondary | ICD-10-CM

## 2018-12-23 LAB — POCT URINALYSIS DIPSTICK OB
Bilirubin, UA: NEGATIVE
Blood, UA: NEGATIVE
Glucose, UA: NEGATIVE
Ketones, UA: NEGATIVE
Leukocytes, UA: NEGATIVE
Nitrite, UA: NEGATIVE
Spec Grav, UA: 1.02 (ref 1.010–1.025)
Urobilinogen, UA: 0.2 E.U./dL
pH, UA: 6 (ref 5.0–8.0)

## 2018-12-23 MED ORDER — TAMSULOSIN HCL 0.4 MG PO CAPS: 0.4000 mg | ORAL_CAPSULE | Freq: Every day | ORAL | 3 refills | Status: DC

## 2018-12-23 NOTE — Progress Notes (Signed)
Patient ID: Remmington Urieta, male    DOB: 05-28-1960  Age: 58 y.o. MRN: [id number]    Subjective:  Subjective  HPI Darby Shadwick presents for complaint of urinary frequency x several weeks.  No penile d/c .  No pain with urination.  No fevers.  No flank pain.   Review of Systems  Constitutional: Negative for appetite change, diaphoresis, fatigue and unexpected weight change.  Eyes: Negative for pain, redness and visual disturbance.  Respiratory: Negative for cough, chest tightness, shortness of breath and wheezing.   Cardiovascular: Negative for chest pain, palpitations and leg swelling.  Endocrine: Negative for cold intolerance, heat intolerance, polydipsia, polyphagia and polyuria.  Genitourinary: Positive for frequency. Negative for decreased urine volume, difficulty urinating, discharge, dysuria, flank pain, genital sores, hematuria, penile pain, penile swelling, scrotal swelling, testicular pain and urgency.  Neurological: Negative for dizziness, light-headedness, numbness and headaches.    History Past Medical History:  Diagnosis Date  . Allergy    nasal congestion recently. Seeing allergist tomorrow.  . Asthma   . Depression   . Hemorrhoids   . Hyperthyroidism 02/28/2015    He has a past surgical history that includes Abdominal hernia repair; Toe amputation; and Hemorrhoid surgery.   His family history includes Heart disease in his father and mother.He reports that he has never smoked. He has never used smokeless tobacco. He reports current alcohol use. He reports current drug use. Drug: Marijuana.  Current Outpatient Medications on File Prior to Visit  Medication Sig Dispense Refill  . fluticasone (FLONASE) 50 MCG/ACT nasal spray Place 2 sprays into both nostrils daily. 16 g 1  . sertraline (ZOLOFT) 100 MG tablet Take 1 tablet (100 mg total) by mouth daily. 90 tablet 0  . sildenafil (REVATIO) 20 MG tablet TAKE 2-4 TABLETS BY MOUTH DAILY AS NEEDED. TAKE NO MORE THAN 4  TABLETS IN A 24 HOUR PERIOD 50 tablet 0  . temazepam (RESTORIL) 30 MG capsule Take 1 capsule (30 mg total) by mouth at bedtime. 30 capsule 1  . ciprofloxacin (CIPRO) 500 MG tablet Take 1 tablet (500 mg total) by mouth 2 (two) times daily. (Patient not taking: Reported on 12/23/2018) 6 tablet 0  . ciprofloxacin (CIPRO) 500 MG tablet Take 1 tablet (500 mg total) by mouth 2 (two) times daily. (Patient not taking: Reported on 12/23/2018) 14 tablet 0   No current facility-administered medications on file prior to visit.      Objective:  Objective  Physical Exam Vitals signs and nursing note reviewed. Exam conducted with a chaperone present.  Constitutional:      General: He is sleeping.     Appearance: He is well-developed.  HENT:     Head: Normocephalic and atraumatic.  Eyes:     Pupils: Pupils are equal, round, and reactive to light.  Neck:     Musculoskeletal: Normal range of motion and neck supple.     Thyroid: No thyromegaly.  Cardiovascular:     Rate and Rhythm: Normal rate and regular rhythm.     Heart sounds: No murmur.  Pulmonary:     Effort: Pulmonary effort is normal. No respiratory distress.     Breath sounds: Normal breath sounds. No wheezing or rales.  Chest:     Chest wall: No tenderness.  Genitourinary:    Prostate: Enlarged.  Musculoskeletal:        General: No tenderness.  Skin:    General: Skin is warm and dry.  Neurological:     Mental Status: He is  oriented to person, place, and time.  Psychiatric:        Behavior: Behavior normal.        Thought Content: Thought content normal.        Judgment: Judgment normal.    BP 132/80 (BP Location: Left Arm, Patient Position: Sitting, Cuff Size: Normal)   Pulse 81   Temp 97.9 F (36.6 C) (Temporal)   Resp 18   Ht 5\' 8"  (1.727 m)   Wt 152 lb (68.9 kg)   SpO2 97%   BMI 23.11 kg/m  Wt Readings from Last 3 Encounters:  12/23/18 152 lb (68.9 kg)  11/18/18 150 lb 6.4 oz (68.2 kg)  11/08/18 147 lb 9.6 oz (67 kg)      Lab Results  Component Value Date   WBC 6.0 12/23/2018   HGB 11.9 (L) 12/23/2018   HCT 36.7 (L) 12/23/2018   PLT 425.0 (H) 12/23/2018   GLUCOSE 101 (H) 12/23/2018   CHOL 170 10/28/2018   TRIG 50 10/28/2018   HDL 46 10/28/2018   LDLCALC 114 (H) 10/28/2018   ALT 9 12/23/2018   AST 8 12/23/2018   NA 136 12/23/2018   K 3.9 12/23/2018   CL 97 12/23/2018   CREATININE 0.82 12/23/2018   BUN 13 12/23/2018   CO2 30 12/23/2018   TSH 0.634 10/28/2018   PSA 0.38 12/23/2018   HGBA1C 6.1 (H) 10/28/2018    Dg Ribs Unilateral W/chest Right  Result Date: 11/18/2018 CLINICAL DATA:  Pain for 2 weeks. EXAM: RIGHT RIBS AND CHEST - 3+ VIEW COMPARISON:  Chest x-ray July 15, 2017 FINDINGS: The heart, hila, mediastinum, lungs, and pleura are unremarkable. No pneumothorax. No rib fractures are seen. No other acute abnormalities. IMPRESSION: Negative. Electronically Signed   By: Dorise Bullion III M.D   On: 11/18/2018 10:00   Dg Abd 1 View  Result Date: 11/18/2018 CLINICAL DATA:  Right flank pain. EXAM: ABDOMEN - 1 VIEW COMPARISON:  None. FINDINGS: The bowel gas pattern is normal. No radio-opaque calculi or other significant radiographic abnormality are seen. IMPRESSION: Negative. Electronically Signed   By: Dorise Bullion III M.D   On: 11/18/2018 09:57     Assessment & Plan:  Plan  I am having [name] "Merry Proud" maintain his fluticasone, temazepam, sildenafil, ciprofloxacin, ciprofloxacin, and sertraline.  Meds ordered this encounter  Medications  . DISCONTD: tamsulosin (FLOMAX) 0.4 MG CAPS capsule    Sig: Take 1 capsule (0.4 mg total) by mouth daily.    Dispense:  30 capsule    Refill:  3    Problem List Items Addressed This Visit    None    Visit Diagnoses    Frequent urination    -  Primary   Relevant Orders   Urine Culture (Completed)   POC Urinalysis Dipstick OB (Completed)   PSA (Completed)   CBC with Differential/Platelet (Completed)   Comprehensive metabolic panel  (Completed)   POC Hemoccult Bld/Stl (1-Cd Office Dx) (Completed)    if urine and labs normal consider flomax / uroloy referral   Follow-up: No follow-ups on file.  Ann Held, DO    --------------

## 2018-12-23 NOTE — Patient Instructions (Signed)
Urinary Frequency, Adult Urinary frequency means urinating more often than usual. You may urinate every 1-2 hours even though you drink a normal amount of fluid and do not have a bladder infection or condition. Although you urinate more often than normal, the total amount of urine produced in a day is normal. With urinary frequency, you may have an urgent need to urinate often. The stress and anxiety of needing to find a bathroom quickly can make this urge worse. This condition may go away on its own or you may need treatment at home. Home treatment may include bladder training, exercises, taking medicines, or making changes to your diet. Follow these instructions at home: Bladder health   Keep a bladder diary if told by your health care provider. Keep track of: ? What you eat and drink. ? How often you urinate. ? How much you urinate.  Follow a bladder training program if told by your health care provider. This may include: ? Learning to delay going to the bathroom. ? Double urinating (voiding). This helps if you are not completely emptying your bladder. ? Scheduled voiding.  Do Kegel exercises as told by your health care provider. Kegel exercises strengthen the muscles that help control urination, which may help the condition. Eating and drinking  If told by your health care provider, make diet changes, such as: ? Avoiding caffeine. ? Drinking fewer fluids, especially alcohol. ? Not drinking in the evening. ? Avoiding foods or drinks that may irritate the bladder. These include coffee, tea, soda, artificial sweeteners, citrus, tomato-based foods, and chocolate. ? Eating foods that help prevent or ease constipation. Constipation can make this condition worse. Your health care provider may recommend that you:  Drink enough fluid to keep your urine pale yellow.  Take over-the-counter or prescription medicines.  Eat foods that are high in fiber, such as beans, whole grains, and fresh  fruits and vegetables.  Limit foods that are high in fat and processed sugars, such as fried or sweet foods. General instructions  Take over-the-counter and prescription medicines only as told by your health care provider.  Keep all follow-up visits as told by your health care provider. This is important. Contact a health care provider if:  You start urinating more often.  You feel pain or irritation when you urinate.  You notice blood in your urine.  Your urine looks cloudy.  You develop a fever.  You begin vomiting. Get help right away if:  You are unable to urinate. Summary  Urinary frequency means urinating more often than usual. With urinary frequency, you may urinate every 1-2 hours even though you drink a normal amount of fluid and do not have a bladder infection or other bladder condition.  Your health care provider may recommend that you keep a bladder diary, follow a bladder training program, or make dietary changes.  If told by your health care provider, do Kegel exercises to strengthen the muscles that help control urination.  Take over-the-counter and prescription medicines only as told by your health care provider.  Contact a health care provider if your symptoms do not improve or get worse. This information is not intended to replace advice given to you by your health care provider. Make sure you discuss any questions you have with your health care provider. Document Released: 02/15/2009 Document Revised: 10/29/2017 Document Reviewed: 10/29/2017 Elsevier Patient Education  2020 [employer].

## 2018-12-24 LAB — CBC WITH DIFFERENTIAL/PLATELET
Basophils Absolute: 0.1 10*3/uL (ref 0.0–0.1)
Basophils Relative: 1.3 % (ref 0.0–3.0)
Eosinophils Absolute: 0.1 10*3/uL (ref 0.0–0.7)
Eosinophils Relative: 1.1 % (ref 0.0–5.0)
HCT: 36.7 % — ABNORMAL LOW (ref 39.0–52.0)
Hemoglobin: 11.9 g/dL — ABNORMAL LOW (ref 13.0–17.0)
Lymphocytes Relative: 24.2 % (ref 12.0–46.0)
Lymphs Abs: 1.5 10*3/uL (ref 0.7–4.0)
MCHC: 32.4 g/dL (ref 30.0–36.0)
MCV: 90.3 fl (ref 78.0–100.0)
Monocytes Absolute: 0.5 10*3/uL (ref 0.1–1.0)
Monocytes Relative: 8 % (ref 3.0–12.0)
Neutro Abs: 3.9 10*3/uL (ref 1.4–7.7)
Neutrophils Relative %: 65.4 % (ref 43.0–77.0)
Platelets: 425 10*3/uL — ABNORMAL HIGH (ref 150.0–400.0)
RBC: 4.06 Mil/uL — ABNORMAL LOW (ref 4.22–5.81)
RDW: 13.4 % (ref 11.5–15.5)
WBC: 6 10*3/uL (ref 4.0–10.5)

## 2018-12-24 LAB — COMPREHENSIVE METABOLIC PANEL
ALT: 9 U/L (ref 0–53)
AST: 8 U/L (ref 0–37)
Albumin: 3.7 g/dL (ref 3.5–5.2)
Alkaline Phosphatase: 96 U/L (ref 39–117)
BUN: 13 mg/dL (ref 6–23)
CO2: 30 mEq/L (ref 19–32)
Calcium: 8.9 mg/dL (ref 8.4–10.5)
Chloride: 97 mEq/L (ref 96–112)
Creatinine, Ser: 0.82 mg/dL (ref 0.40–1.50)
GFR: 117.56 mL/min (ref >60.00)
Glucose, Bld: 101 mg/dL — ABNORMAL HIGH (ref 70–99)
Potassium: 3.9 mEq/L (ref 3.5–5.1)
Sodium: 136 mEq/L (ref 135–145)
Total Bilirubin: 0.3 mg/dL (ref 0.2–1.2)
Total Protein: 7.2 g/dL (ref 6.0–8.3)

## 2018-12-24 LAB — POC HEMOCCULT BLD/STL (OFFICE/1-CARD/DIAGNOSTIC)
Card #1 Date: 8202020
Fecal Occult Blood, POC: NEGATIVE

## 2018-12-24 LAB — URINE CULTURE
MICRO NUMBER:: 793444
Result:: NO GROWTH
SPECIMEN QUALITY:: ADEQUATE

## 2018-12-24 LAB — PSA: PSA: 0.38 ng/mL (ref 0.10–4.00)

## 2018-12-24 MED ORDER — TAMSULOSIN HCL 0.4 MG PO CAPS: 0.4000 mg | ORAL_CAPSULE | Freq: Every day | ORAL | 3 refills | Status: AC

## 2018-12-24 NOTE — Addendum Note (Signed)
Addended byDamita Dunnings D on: 12/24/2018 01:58 PM   Modules accepted: Orders

## 2018-12-27 ENCOUNTER — Other Ambulatory Visit: Payer: Self-pay | Admitting: *Deleted

## 2018-12-27 ENCOUNTER — Ambulatory Visit: Payer: 59 | Admitting: Medical

## 2018-12-27 DIAGNOSIS — D649 Anemia, unspecified: Secondary | ICD-10-CM

## 2018-12-27 DIAGNOSIS — R739 Hyperglycemia, unspecified: Secondary | ICD-10-CM

## 2018-12-27 DIAGNOSIS — R350 Frequency of micturition: Secondary | ICD-10-CM

## 2019-01-06 ENCOUNTER — Other Ambulatory Visit: Payer: Self-pay

## 2019-01-06 NOTE — Progress Notes (Signed)
Subjective:    Patient ID: Jon Reyes, male    DOB: [birth year], 58 y.o.   MRN: [id number]  HPI Pt in for cpe/wellness exam.  Moderate healthy diet but not eating a lot since death of his son(appetite better since last visit). Some mild exercise walking dog. Struggled with some alcohol use initially after death of son(has not been drinking etoh). Pt is married. He has been Patent examiner.  Pt needs flu vaccine. Recommended today.       Review of Systems  Constitutional: Negative for chills, fatigue and fever.  HENT: Negative for congestion, ear discharge, ear pain and postnasal drip.   Respiratory: Negative for cough, shortness of breath and wheezing.   Cardiovascular: Negative for chest pain and palpitations.  Gastrointestinal: Negative for abdominal distention, abdominal pain, constipation, diarrhea and nausea.  Genitourinary: Positive for frequency. Negative for flank pain and urgency.       Referral to urologist placed.  Musculoskeletal: Negative for back pain, joint swelling and neck pain.  Skin: Negative for rash.  Neurological: Negative for dizziness, light-headedness and numbness.  Hematological: Negative for adenopathy. Does not bruise/bleed easily.  Psychiatric/Behavioral: Positive for dysphoric mood. Negative for behavioral problems, confusion, hallucinations and suicidal ideas. The patient is nervous/anxious.        Mood improving slowly with medication and counseling.     Past Medical History:  Diagnosis Date   Allergy    nasal congestion recently. Seeing allergist tomorrow.   Asthma    Depression    Hemorrhoids    Hyperthyroidism 02/28/2015     Social History   Socioeconomic History   Marital status: Single    Spouse name: Not on file   Number of children: Not on file   Years of education: Not on file   Highest education level: Not on file  Occupational History   Not on file  Social Needs   Financial resource strain: Not on file     Food insecurity    Worry: Not on file    Inability: Not on file   Transportation needs    Medical: Not on file    Non-medical: Not on file  Tobacco Use   Smoking status: Never Smoker   Smokeless tobacco: Never Used  Substance and Sexual Activity   Alcohol use: Yes    Comment: very rare seldom.   Drug use: Yes    Types: Marijuana   Sexual activity: Yes    Birth control/protection: None  Lifestyle   Physical activity    Days per week: Not on file    Minutes per session: Not on file   Stress: Not on file  Relationships   Social connections    Talks on phone: Not on file    Gets together: Not on file    Attends religious service: Not on file    Active member of club or organization: Not on file    Attends meetings of clubs or organizations: Not on file    Relationship status: Not on file   Intimate partner violence    Fear of current or ex partner: Not on file    Emotionally abused: Not on file    Physically abused: Not on file    Forced sexual activity: Not on file  Other Topics Concern   Not on file  Social History Narrative   Not on file    Past Surgical History:  Procedure Laterality Date   [place]  TOE AMPUTATION     right foot    Family History  Problem Relation Age of Onset   Heart disease Mother    Heart disease Father    Thyroid disease Neg Hx     No Known Allergies  Current Outpatient Medications on File Prior to Visit  Medication Sig Dispense Refill   ciprofloxacin (CIPRO) 500 MG tablet Take 1 tablet (500 mg total) by mouth 2 (two) times daily. (Patient not taking: Reported on 12/23/2018) 6 tablet 0   ciprofloxacin (CIPRO) 500 MG tablet Take 1 tablet (500 mg total) by mouth 2 (two) times daily. (Patient not taking: Reported on 12/23/2018) 14 tablet 0   fluticasone (FLONASE) 50 MCG/ACT nasal spray Place 2 sprays into both nostrils daily. 16 g 1   sertraline (ZOLOFT) 100 MG tablet Take  1 tablet (100 mg total) by mouth daily. 90 tablet 0   sildenafil (REVATIO) 20 MG tablet TAKE 2-4 TABLETS BY MOUTH DAILY AS NEEDED. TAKE NO MORE THAN 4 TABLETS IN A 24 HOUR PERIOD 50 tablet 0   tamsulosin (FLOMAX) 0.4 MG CAPS capsule Take 1 capsule (0.4 mg total) by mouth daily. 30 capsule 3   temazepam (RESTORIL) 30 MG capsule Take 1 capsule (30 mg total) by mouth at bedtime. 30 capsule 1   No current facility-administered medications on file prior to visit.     There were no vitals taken for this visit.      Objective:   Physical Exam  General Mental Status- Alert. General Appearance- Not in acute distress.   Skin General: Color- Normal Color. Moisture- Normal Moisture.  Neck Carotid Arteries- Normal color. Moisture- Normal Moisture. No carotid bruits. No JVD.  Chest and Lung Exam Auscultation: Breath Sounds:-Normal.  Cardiovascular Auscultation:Rythm- Regular. Murmurs & Other Heart Sounds:Auscultation of the heart reveals- No Murmurs.  Abdomen Inspection:-Inspeection Normal. Palpation/Percussion:Note:No mass. Palpation and Percussion of the abdomen reveal- Non Tender, Non Distended + BS, no rebound or guarding.   Neurologic Cranial Nerve exam:- CN III-XII intact(No nystagmus), symmetric smile. Strength:- 5/5 equal and symmetric strength both upper and lower extremities.      Assessment & Plan:  For you wellness exam today I have ordered cbc, cmp, lipid panel and ua. Urine culture added due frequent urination hx.  Flu vaccine given today.  Recommend exercise and healthy diet.  We will let you know lab results as they come in.  Follow up date appointment will be determined after lab review.  Continue sertraline  for mood and anxiety(psych added seroquel). Continue with counseling. Reminded on thoughts of harm to self or others then ED evaluation. Glad to hear you are improving.  Follow up date to be determined after lab review.  Mackie Pai, PA-C

## 2019-01-06 NOTE — Patient Instructions (Addendum)
For you wellness exam today I have ordered cbc, cmp, lipid panel and ua. Urine culture added due frequent urination hx.  Flu vaccine given today.  Recommend exercise and healthy diet.  We will let you know lab results as they come in.  Follow up date appointment will be determined after lab review.  Continue sertraline  for mood and anxiety([psych added seroquel). Continue with counseling. Reminded on thoughts of harm to self or others then ED evaluation. Glad to hear you are improving.  Referral to urologist placed already for frequent urination. Would ask you call [facility]  location to follow up on referral.  Follow up date to be determined after lab review.    Preventive Care 1-36 Years Old, Male Preventive care refers to lifestyle choices and visits with your health care provider that can promote health and wellness. This includes:  A yearly physical exam. This is also called an annual well check.  Regular dental and eye exams.  Immunizations.  Screening for certain conditions.  Healthy lifestyle choices, such as eating a healthy diet, getting regular exercise, not using drugs or products that contain nicotine and tobacco, and limiting alcohol use. What can I expect for my preventive care visit? Physical exam Your health care provider will check:  Height and weight. These may be used to calculate body mass index (BMI), which is a measurement that tells if you are at a healthy weight.  Heart rate and blood pressure.  Your skin for abnormal spots. Counseling Your health care provider may ask you questions about:  Alcohol, tobacco, and drug use.  Emotional well-being.  Home and relationship well-being.  Sexual activity.  Eating habits.  Work and work Statistician. What immunizations do I need?  Influenza (flu) vaccine  This is recommended every year. Tetanus, diphtheria, and pertussis (Tdap) vaccine  You may need a Td booster every 10  years. Varicella (chickenpox) vaccine  You may need this vaccine if you have not already been vaccinated. Zoster (shingles) vaccine  You may need this after age 5. Measles, mumps, and rubella (MMR) vaccine  You may need at least one dose of MMR if you were born in 1957 or later. You may also need a second dose. Pneumococcal conjugate (PCV13) vaccine  You may need this if you have certain conditions and were not previously vaccinated. Pneumococcal polysaccharide (PPSV23) vaccine  You may need one or two doses if you smoke cigarettes or if you have certain conditions. Meningococcal conjugate (MenACWY) vaccine  You may need this if you have certain conditions. Hepatitis A vaccine  You may need this if you have certain conditions or if you travel or work in places where you may be exposed to hepatitis A. Hepatitis B vaccine  You may need this if you have certain conditions or if you travel or work in places where you may be exposed to hepatitis B. Haemophilus influenzae type b (Hib) vaccine  You may need this if you have certain risk factors. Human papillomavirus (HPV) vaccine  If recommended by your health care provider, you may need three doses over 6 months. You may receive vaccines as individual doses or as more than one vaccine together in one shot (combination vaccines). Talk with your health care provider about the risks and benefits of combination vaccines. What tests do I need? Blood tests  Lipid and cholesterol levels. These may be checked every 5 years, or more frequently if you are over 78 years old.  Hepatitis C test.  Hepatitis  B test. Screening  Lung cancer screening. You may have this screening every year starting at age 5 if you have a 30-pack-year history of smoking and currently smoke or have quit within the past 15 years.  Prostate cancer screening. Recommendations will vary depending on your family history and other risks.  Colorectal cancer screening.  All adults should have this screening starting at age 70 and continuing until age 75. Your health care provider may recommend screening at age 65 if you are at increased risk. You will have tests every 1-10 years, depending on your results and the type of screening test.  Diabetes screening. This is done by checking your blood sugar (glucose) after you have not eaten for a while (fasting). You may have this done every 1-3 years.  Sexually transmitted disease (STD) testing. Follow these instructions at home: Eating and drinking  Eat a diet that includes fresh fruits and vegetables, whole grains, lean protein, and low-fat dairy products.  Take vitamin and mineral supplements as recommended by your health care provider.  Do not drink alcohol if your health care provider tells you not to drink.  If you drink alcohol: ? Limit how much you have to 0-2 drinks a day. ? Be aware of how much alcohol is in your drink. In the [place] one drink equals one 12 oz bottle of beer (355 mL), one 5 oz glass of wine (148 mL), or one 1 oz glass of hard liquor (44 mL). Lifestyle  Take daily care of your teeth and gums.  Stay active. Exercise for at least 30 minutes on 5 or more days each week.  Do not use any products that contain nicotine or tobacco, such as cigarettes, e-cigarettes, and chewing tobacco. If you need help quitting, ask your health care provider.  If you are sexually active, practice safe sex. Use a condom or other form of protection to prevent STIs (sexually transmitted infections).  Talk with your health care provider about taking a low-dose aspirin every day starting at age 55. What's next?  Go to your health care provider once a year for a well check visit.  Ask your health care provider how often you should have your eyes and teeth checked.  Stay up to date on all vaccines. This information is not intended to replace advice given to you by your health care provider. Make sure you  discuss any questions you have with your health care provider. Document Released: 05/18/2015 Document Revised: 04/15/2018 Document Reviewed: 04/15/2018 Elsevier Patient Education  2020 [employer].

## 2019-01-07 ENCOUNTER — Encounter: Payer: Self-pay | Admitting: Medical

## 2019-01-07 ENCOUNTER — Ambulatory Visit (INDEPENDENT_AMBULATORY_CARE_PROVIDER_SITE_OTHER): Payer: 59 | Admitting: Medical

## 2019-01-07 ENCOUNTER — Encounter: Payer: 59 | Admitting: Medical

## 2019-01-07 VITALS — BP 130/96 | HR 74 | Temp 97.7°F | Resp 16 | Ht 68.0 in | Wt 153.8 lb

## 2019-01-07 DIAGNOSIS — Z0000 Encounter for general adult medical examination without abnormal findings: Secondary | ICD-10-CM

## 2019-01-07 DIAGNOSIS — Z23 Encounter for immunization: Secondary | ICD-10-CM | POA: Diagnosis not present

## 2019-01-07 DIAGNOSIS — R350 Frequency of micturition: Secondary | ICD-10-CM | POA: Diagnosis not present

## 2019-01-07 DIAGNOSIS — Z0001 Encounter for general adult medical examination with abnormal findings: Secondary | ICD-10-CM

## 2019-01-12 ENCOUNTER — Other Ambulatory Visit: Payer: Self-pay

## 2019-01-12 ENCOUNTER — Telehealth: Payer: Self-pay | Admitting: Medical

## 2019-01-12 ENCOUNTER — Other Ambulatory Visit (INDEPENDENT_AMBULATORY_CARE_PROVIDER_SITE_OTHER): Payer: 59

## 2019-01-12 DIAGNOSIS — D649 Anemia, unspecified: Secondary | ICD-10-CM | POA: Diagnosis not present

## 2019-01-12 DIAGNOSIS — Z0000 Encounter for general adult medical examination without abnormal findings: Secondary | ICD-10-CM | POA: Diagnosis not present

## 2019-01-12 DIAGNOSIS — R350 Frequency of micturition: Secondary | ICD-10-CM

## 2019-01-12 DIAGNOSIS — R739 Hyperglycemia, unspecified: Secondary | ICD-10-CM

## 2019-01-12 LAB — LIPID PANEL
Cholesterol: 163 mg/dL (ref 0–200)
HDL: 46.8 mg/dL (ref >39.00)
LDL Cholesterol: 100 mg/dL — ABNORMAL HIGH (ref 0–99)
NonHDL: 115.85
Total CHOL/HDL Ratio: 3
Triglycerides: 79 mg/dL (ref 0.0–149.0)
VLDL: 15.8 mg/dL (ref 0.0–40.0)

## 2019-01-12 LAB — POC URINALSYSI DIPSTICK (AUTOMATED)
Bilirubin, UA: NEGATIVE
Blood, UA: NEGATIVE
Glucose, UA: NEGATIVE
Ketones, UA: NEGATIVE
Leukocytes, UA: NEGATIVE
Nitrite, UA: NEGATIVE
Protein, UA: POSITIVE — AB
Spec Grav, UA: 1.015 (ref 1.010–1.025)
Urobilinogen, UA: 0.2 E.U./dL
pH, UA: 6 (ref 5.0–8.0)

## 2019-01-12 LAB — CBC WITH DIFFERENTIAL/PLATELET
Basophils Absolute: 0.1 10*3/uL (ref 0.0–0.1)
Basophils Relative: 0.9 % (ref 0.0–3.0)
Eosinophils Absolute: 0.1 10*3/uL (ref 0.0–0.7)
Eosinophils Relative: 1.9 % (ref 0.0–5.0)
HCT: 34.8 % — ABNORMAL LOW (ref 39.0–52.0)
Hemoglobin: 11.2 g/dL — ABNORMAL LOW (ref 13.0–17.0)
Lymphocytes Relative: 21 % (ref 12.0–46.0)
Lymphs Abs: 1.3 10*3/uL (ref 0.7–4.0)
MCHC: 32.3 g/dL (ref 30.0–36.0)
MCV: 88.5 fl (ref 78.0–100.0)
Monocytes Absolute: 0.5 10*3/uL (ref 0.1–1.0)
Monocytes Relative: 8 % (ref 3.0–12.0)
Neutro Abs: 4.1 10*3/uL (ref 1.4–7.7)
Neutrophils Relative %: 68.2 % (ref 43.0–77.0)
Platelets: 389 10*3/uL (ref 150.0–400.0)
RBC: 3.93 Mil/uL — ABNORMAL LOW (ref 4.22–5.81)
RDW: 14.2 % (ref 11.5–15.5)
WBC: 6 10*3/uL (ref 4.0–10.5)

## 2019-01-12 LAB — COMPREHENSIVE METABOLIC PANEL
ALT: 13 U/L (ref 0–53)
AST: 10 U/L (ref 0–37)
Albumin: 3.4 g/dL — ABNORMAL LOW (ref 3.5–5.2)
Alkaline Phosphatase: 106 U/L (ref 39–117)
BUN: 12 mg/dL (ref 6–23)
CO2: 28 mEq/L (ref 19–32)
Calcium: 8.7 mg/dL (ref 8.4–10.5)
Chloride: 102 mEq/L (ref 96–112)
Creatinine, Ser: 0.72 mg/dL (ref 0.40–1.50)
GFR: 136.57 mL/min (ref >60.00)
Glucose, Bld: 144 mg/dL — ABNORMAL HIGH (ref 70–99)
Potassium: 3.6 mEq/L (ref 3.5–5.1)
Sodium: 140 mEq/L (ref 135–145)
Total Bilirubin: 0.2 mg/dL (ref 0.2–1.2)
Total Protein: 7.1 g/dL (ref 6.0–8.3)

## 2019-01-12 LAB — HEMOGLOBIN A1C: Hgb A1c MFr Bld: 6.2 % (ref 4.6–6.5)

## 2019-01-12 MED ORDER — METFORMIN HCL 500 MG PO TABS: 500.0000 mg | ORAL_TABLET | Freq: Every day | ORAL | 3 refills | Status: AC

## 2019-01-12 NOTE — Telephone Encounter (Signed)
Rx metformin sent to pt pharmacy. 

## 2019-01-14 LAB — URINE CULTURE
MICRO NUMBER:: 860846
Result:: NO GROWTH
SPECIMEN QUALITY:: ADEQUATE

## 2019-01-30 ENCOUNTER — Telehealth: Payer: Self-pay | Admitting: Medical

## 2019-01-30 NOTE — Telephone Encounter (Signed)
I got a disability form to fill out. I have not seen patient in about 3 weeks or more. Form appears to want MD to fill out. So not sure I can fill out. Also form has to do with a lot of physical disability type questions such as functional movment questions. His issues has been anxiety and depression affecting ability to work. So I think there may be other appropiate form to fill out.   I need him to have virtual virtual visit or in office visit to discuss.   If I were to fill out I think her would need letter from behavioral health explaining condition. Since current form answers may not give accurate picture.   Can't promise I will fill out form. Want to discuss with him and see how he is doing.

## 2019-01-31 NOTE — Telephone Encounter (Signed)
Pt needs appointment to discuss paperwork.

## 2019-02-01 ENCOUNTER — Other Ambulatory Visit: Payer: Self-pay

## 2019-02-01 ENCOUNTER — Ambulatory Visit (INDEPENDENT_AMBULATORY_CARE_PROVIDER_SITE_OTHER): Payer: 59 | Admitting: Medical

## 2019-02-01 VITALS — Wt 152.0 lb

## 2019-02-01 DIAGNOSIS — F39 Unspecified mood [affective] disorder: Secondary | ICD-10-CM | POA: Diagnosis not present

## 2019-02-01 DIAGNOSIS — F419 Anxiety disorder, unspecified: Secondary | ICD-10-CM

## 2019-02-01 DIAGNOSIS — F332 Major depressive disorder, recurrent severe without psychotic features: Secondary | ICD-10-CM | POA: Diagnosis not present

## 2019-02-01 NOTE — Progress Notes (Signed)
Subjective:    Patient ID: Jon Reyes, male    DOB: June 27, 1960, 58 y.o.   MRN: [id number]  HPI Virtual Visit via Video Note  I connected with [name] on 02/01/19 at  3:40 PM EDT by a video enabled telemedicine application and verified that I am speaking with the correct person using two identifiers.  Location: Patient: home Provider: office   I discussed the limitations of evaluation and management by telemedicine and the availability of in person appointments. The patient expressed understanding and agreed to proceed.  History of Present Illness:  Pt states overall he stats his mood is better. Depression and anxiety is better. He states he has rare and brief fleeting thought that life is futile but then he snaps out of it quickly. No plans to harm self or others. Pt is seeing psychiatrist and psych aware of his feeling. Pt also seeing counselor. Pt is on abilify, seroquel lomotrigine and restoril. Sertraline has been stopped.   But overall he states much better. On review his mood was a lot worse just shortly after death of son.  Pt has fmla paperwork filled out. Psychiatrist/behavioral health has filled out paperwork.    Observations/Objective: General- no acute distress, pleasant, alert and oriented.   Assessment and Plan: Your mood and anxiety is much better overall but not ideal. Still effecting ability to work. Continue meds rx'd by psychiatrist. I went over your new form which has to do more with physical disabilityrather than psychiatric disability. I can later fill out form I have but ask you have mental health specialist fill out form and make comments on how mental condition/health effects ability to work.  If you have worsening mood notify you psychiatrist. If severe as discussed in past then ED [facility].  Follow up with psychiatrist mid October. Update me on return to work plans.  Follow Up Instructions:    I discussed the assessment and  treatment plan with the patient. The patient was provided an opportunity to ask questions and all were answered. The patient agreed with the plan and demonstrated an understanding of the instructions.   The patient was advised to call back or seek an in-person evaluation if the symptoms worsen or if the condition fails to improve as anticipated.  I provided 25 minutes of non-face-to-face time during this encounter.   Mackie Pai, PA-C    Review of Systems  Constitutional: Negative for chills, fatigue and fever.  Respiratory: Negative for cough, chest tightness, shortness of breath and wheezing.   Cardiovascular: Negative for chest pain and palpitations.  Gastrointestinal: Negative for abdominal pain, blood in stool, diarrhea, nausea and vomiting.  Skin: Negative for rash.  Neurological: Negative for dizziness and light-headedness.  Hematological: Negative for adenopathy. Does not bruise/bleed easily.  Psychiatric/Behavioral: Positive for behavioral problems and dysphoric mood. Negative for decreased concentration, hallucinations and suicidal ideas. The patient is nervous/anxious.        See hpi.    Past Medical History:  Diagnosis Date  . Allergy    nasal congestion recently. Seeing allergist tomorrow.  . Asthma   . Depression   . Hemorrhoids   . Hyperthyroidism 02/28/2015     Social History   Socioeconomic History  . Marital status: Single    Spouse name: Not on file  . Number of children: Not on file  . Years of education: Not on file  . Highest education level: Not on file  Occupational History  . Not on file  Social Needs  .  Financial resource strain: Not on file  . Food insecurity    Worry: Not on file    Inability: Not on file  . Transportation needs    Medical: Not on file    Non-medical: Not on file  Tobacco Use  . Smoking status: Never Smoker  . Smokeless tobacco: Never Used  Substance and Sexual Activity  . Alcohol use: Yes    Comment: very rare  seldom.  . Drug use: Yes    Types: Marijuana  . Sexual activity: Yes    Birth control/protection: None  Lifestyle  . Physical activity    Days per week: Not on file    Minutes per session: Not on file  . Stress: Not on file  Relationships  . Social Herbalist on phone: Not on file    Gets together: Not on file    Attends religious service: Not on file    Active member of club or organization: Not on file    Attends meetings of clubs or organizations: Not on file    Relationship status: Not on file  . Intimate partner violence    Fear of current or ex partner: Not on file    Emotionally abused: Not on file    Physically abused: Not on file    Forced sexual activity: Not on file  Other Topics Concern  . Not on file  Social History Narrative  . Not on file    Past Surgical History:  Procedure Laterality Date  . ABDOMINAL HERNIA REPAIR    . HEMORRHOID SURGERY    . TOE AMPUTATION     right foot    Family History  Problem Relation Age of Onset  . Heart disease Mother   . Heart disease Father   . Thyroid disease Neg Hx     No Known Allergies  Current Outpatient Medications on File Prior to Visit  Medication Sig Dispense Refill  . ARIPiprazole (ABILIFY) 10 MG tablet Take 10 mg by mouth daily.    . ciprofloxacin (CIPRO) 500 MG tablet Take 1 tablet (500 mg total) by mouth 2 (two) times daily. (Patient not taking: Reported on 12/23/2018) 6 tablet 0  . fluticasone (FLONASE) 50 MCG/ACT nasal spray Place 2 sprays into both nostrils daily. 16 g 1  . metFORMIN (GLUCOPHAGE) 500 MG tablet Take 1 tablet (500 mg total) by mouth daily with breakfast. 30 tablet 3  . QUEtiapine Fumarate (SEROQUEL XR) 150 MG 24 hr tablet TAKE ONE TABLET BY MOUTH ONE TIME DAILY AT BEDTIME FOR 7 DAYS (DO NOT TAKE WITHIN ONE HOUR OF EATING, TAKE 12 HOURS BEFORE YOU INTEND TO WAK    . sertraline (ZOLOFT) 100 MG tablet Take 1 tablet (100 mg total) by mouth daily. 90 tablet 0  . sildenafil (REVATIO)  20 MG tablet TAKE 2-4 TABLETS BY MOUTH DAILY AS NEEDED. TAKE NO MORE THAN 4 TABLETS IN A 24 HOUR PERIOD 50 tablet 0  . tamsulosin (FLOMAX) 0.4 MG CAPS capsule Take 1 capsule (0.4 mg total) by mouth daily. 30 capsule 3  . temazepam (RESTORIL) 30 MG capsule Take 1 capsule (30 mg total) by mouth at bedtime. 30 capsule 1   No current facility-administered medications on file prior to visit.     Wt 152 lb (68.9 kg)   BMI 23.11 kg/m       Objective:   Physical Exam        Assessment & Plan:

## 2019-02-02 NOTE — Patient Instructions (Signed)
Your mood and anxiety is much better overall but not ideal. Still effecting ability to work. Continue meds rx'd by psychiatrist. I went over your new form which has to do more with physical disabilityrather than psychiatric disability. I can later fill out form I have but ask you have mental health specialist fill out form and make comments on how mental condition/health effects ability to work.  If you have worsening mood notify you psychiatrist. If severe as discussed in past then ED [facility].  Follow up with psychiatrist mid October. Update me on return to work plans.

## 2019-02-14 ENCOUNTER — Telehealth: Payer: Self-pay | Admitting: Medical

## 2019-02-14 DIAGNOSIS — F3181 Bipolar II disorder: Secondary | ICD-10-CM | POA: Diagnosis not present

## 2019-02-14 DIAGNOSIS — F411 Generalized anxiety disorder: Secondary | ICD-10-CM | POA: Diagnosis not present

## 2019-02-14 DIAGNOSIS — F900 Attention-deficit hyperactivity disorder, predominantly inattentive type: Secondary | ICD-10-CM | POA: Diagnosis not present

## 2019-02-14 NOTE — Telephone Encounter (Signed)
I did fill out his physical condition form. But a lot of questions have to do with physical impairment. He is currently missing wok to psychiatric condition. So want him to get his psychiatrist to fill out forms as well and give opinion on when can return to work and explain impact on his work.  Also will you ask him dominant hand. Need to check off on form before fax over

## 2019-02-15 NOTE — Telephone Encounter (Signed)
Notified pt. Pt have me fax and [facility] [phone]. Pt's right hand is dominant hand.

## 2019-02-15 NOTE — Telephone Encounter (Signed)
I can write that in. Do you have the paperwork?

## 2019-02-15 NOTE — Telephone Encounter (Signed)
Would you check off rt hand on the sheet please.

## 2019-03-01 ENCOUNTER — Other Ambulatory Visit: Payer: Self-pay

## 2019-03-01 MED ORDER — SILDENAFIL CITRATE 20 MG PO TABS: ORAL_TABLET | ORAL | 0 refills | Status: AC

## 2019-03-01 NOTE — Progress Notes (Signed)
Pt requesting refill on Sildenafil. Please advise.

## 2019-04-19 DIAGNOSIS — F411 Generalized anxiety disorder: Secondary | ICD-10-CM | POA: Diagnosis not present

## 2019-04-19 DIAGNOSIS — F3181 Bipolar II disorder: Secondary | ICD-10-CM | POA: Diagnosis not present

## 2019-04-19 DIAGNOSIS — F900 Attention-deficit hyperactivity disorder, predominantly inattentive type: Secondary | ICD-10-CM | POA: Diagnosis not present

## 2019-04-21 DIAGNOSIS — F3181 Bipolar II disorder: Secondary | ICD-10-CM | POA: Diagnosis not present

## 2019-04-21 DIAGNOSIS — R7309 Other abnormal glucose: Secondary | ICD-10-CM | POA: Diagnosis not present

## 2019-05-12 ENCOUNTER — Telehealth: Payer: Self-pay | Admitting: Medical

## 2019-05-12 ENCOUNTER — Other Ambulatory Visit: Payer: Self-pay

## 2019-05-12 DIAGNOSIS — C649 Malignant neoplasm of unspecified kidney, except renal pelvis: Secondary | ICD-10-CM | POA: Diagnosis not present

## 2019-05-12 DIAGNOSIS — C7802 Secondary malignant neoplasm of left lung: Secondary | ICD-10-CM | POA: Diagnosis not present

## 2019-05-12 DIAGNOSIS — E8809 Other disorders of plasma-protein metabolism, not elsewhere classified: Secondary | ICD-10-CM | POA: Diagnosis not present

## 2019-05-12 DIAGNOSIS — E876 Hypokalemia: Secondary | ICD-10-CM | POA: Diagnosis not present

## 2019-05-12 DIAGNOSIS — R918 Other nonspecific abnormal finding of lung field: Secondary | ICD-10-CM | POA: Diagnosis not present

## 2019-05-12 DIAGNOSIS — R52 Pain, unspecified: Secondary | ICD-10-CM | POA: Diagnosis not present

## 2019-05-12 DIAGNOSIS — R809 Proteinuria, unspecified: Secondary | ICD-10-CM | POA: Diagnosis not present

## 2019-05-12 DIAGNOSIS — R600 Localized edema: Secondary | ICD-10-CM | POA: Diagnosis not present

## 2019-05-12 DIAGNOSIS — J90 Pleural effusion, not elsewhere classified: Secondary | ICD-10-CM | POA: Diagnosis not present

## 2019-05-12 DIAGNOSIS — R310 Gross hematuria: Secondary | ICD-10-CM | POA: Diagnosis not present

## 2019-05-12 DIAGNOSIS — K648 Other hemorrhoids: Secondary | ICD-10-CM | POA: Diagnosis not present

## 2019-05-12 DIAGNOSIS — E538 Deficiency of other specified B group vitamins: Secondary | ICD-10-CM | POA: Diagnosis not present

## 2019-05-12 DIAGNOSIS — Z85528 Personal history of other malignant neoplasm of kidney: Secondary | ICD-10-CM | POA: Diagnosis not present

## 2019-05-12 DIAGNOSIS — F329 Major depressive disorder, single episode, unspecified: Secondary | ICD-10-CM | POA: Diagnosis not present

## 2019-05-12 DIAGNOSIS — N5089 Other specified disorders of the male genital organs: Secondary | ICD-10-CM | POA: Diagnosis not present

## 2019-05-12 DIAGNOSIS — C7801 Secondary malignant neoplasm of right lung: Secondary | ICD-10-CM | POA: Diagnosis not present

## 2019-05-12 DIAGNOSIS — C7800 Secondary malignant neoplasm of unspecified lung: Secondary | ICD-10-CM | POA: Diagnosis not present

## 2019-05-12 DIAGNOSIS — R000 Tachycardia, unspecified: Secondary | ICD-10-CM | POA: Diagnosis not present

## 2019-05-12 DIAGNOSIS — R9431 Abnormal electrocardiogram [ECG] [EKG]: Secondary | ICD-10-CM | POA: Diagnosis not present

## 2019-05-12 DIAGNOSIS — E43 Unspecified severe protein-calorie malnutrition: Secondary | ICD-10-CM | POA: Diagnosis not present

## 2019-05-12 DIAGNOSIS — Z515 Encounter for palliative care: Secondary | ICD-10-CM | POA: Diagnosis not present

## 2019-05-12 DIAGNOSIS — R319 Hematuria, unspecified: Secondary | ICD-10-CM | POA: Diagnosis not present

## 2019-05-12 DIAGNOSIS — M7989 Other specified soft tissue disorders: Secondary | ICD-10-CM | POA: Diagnosis not present

## 2019-05-12 DIAGNOSIS — N1330 Unspecified hydronephrosis: Secondary | ICD-10-CM | POA: Diagnosis not present

## 2019-05-12 DIAGNOSIS — Z79899 Other long term (current) drug therapy: Secondary | ICD-10-CM | POA: Diagnosis not present

## 2019-05-12 DIAGNOSIS — R609 Edema, unspecified: Secondary | ICD-10-CM | POA: Diagnosis not present

## 2019-05-12 DIAGNOSIS — D509 Iron deficiency anemia, unspecified: Secondary | ICD-10-CM | POA: Diagnosis not present

## 2019-05-12 DIAGNOSIS — C801 Malignant (primary) neoplasm, unspecified: Secondary | ICD-10-CM | POA: Diagnosis not present

## 2019-05-12 DIAGNOSIS — R1900 Intra-abdominal and pelvic swelling, mass and lump, unspecified site: Secondary | ICD-10-CM | POA: Diagnosis not present

## 2019-05-12 DIAGNOSIS — D649 Anemia, unspecified: Secondary | ICD-10-CM | POA: Diagnosis not present

## 2019-05-12 DIAGNOSIS — C770 Secondary and unspecified malignant neoplasm of lymph nodes of head, face and neck: Secondary | ICD-10-CM | POA: Diagnosis not present

## 2019-05-12 DIAGNOSIS — R1909 Other intra-abdominal and pelvic swelling, mass and lump: Secondary | ICD-10-CM | POA: Diagnosis not present

## 2019-05-12 DIAGNOSIS — C641 Malignant neoplasm of right kidney, except renal pelvis: Secondary | ICD-10-CM | POA: Diagnosis not present

## 2019-05-12 DIAGNOSIS — C642 Malignant neoplasm of left kidney, except renal pelvis: Secondary | ICD-10-CM | POA: Diagnosis not present

## 2019-05-12 DIAGNOSIS — C799 Secondary malignant neoplasm of unspecified site: Secondary | ICD-10-CM | POA: Diagnosis not present

## 2019-05-12 DIAGNOSIS — I871 Compression of vein: Secondary | ICD-10-CM | POA: Diagnosis not present

## 2019-05-12 DIAGNOSIS — Z8719 Personal history of other diseases of the digestive system: Secondary | ICD-10-CM | POA: Diagnosis not present

## 2019-05-12 DIAGNOSIS — F419 Anxiety disorder, unspecified: Secondary | ICD-10-CM | POA: Diagnosis not present

## 2019-05-12 DIAGNOSIS — R188 Other ascites: Secondary | ICD-10-CM | POA: Diagnosis not present

## 2019-05-12 DIAGNOSIS — R599 Enlarged lymph nodes, unspecified: Secondary | ICD-10-CM | POA: Diagnosis not present

## 2019-05-12 DIAGNOSIS — N1339 Other hydronephrosis: Secondary | ICD-10-CM | POA: Diagnosis not present

## 2019-05-12 DIAGNOSIS — R748 Abnormal levels of other serum enzymes: Secondary | ICD-10-CM | POA: Diagnosis not present

## 2019-05-12 DIAGNOSIS — R634 Abnormal weight loss: Secondary | ICD-10-CM | POA: Diagnosis not present

## 2019-05-12 DIAGNOSIS — D508 Other iron deficiency anemias: Secondary | ICD-10-CM | POA: Diagnosis not present

## 2019-05-12 DIAGNOSIS — J918 Pleural effusion in other conditions classified elsewhere: Secondary | ICD-10-CM | POA: Diagnosis not present

## 2019-05-12 DIAGNOSIS — R590 Localized enlarged lymph nodes: Secondary | ICD-10-CM | POA: Diagnosis not present

## 2019-05-12 DIAGNOSIS — I313 Pericardial effusion (noninflammatory): Secondary | ICD-10-CM | POA: Diagnosis not present

## 2019-05-12 NOTE — Telephone Encounter (Signed)
Pt spoke with Rod Holler. Pt has 130 appointment tomorrow.

## 2019-05-12 NOTE — Telephone Encounter (Signed)
Patient is calling to get advice from [facility]. Call disconnected twice -before able to get scheduler on the phone. Per the recommendation of Kristi, patient is wanting advice for a "shriveled up penis." Please advise Cb- [phone]

## 2019-05-13 ENCOUNTER — Ambulatory Visit: Payer: 59 | Admitting: Family Medicine

## 2019-05-13 DIAGNOSIS — D508 Other iron deficiency anemias: Secondary | ICD-10-CM | POA: Diagnosis not present

## 2019-05-13 DIAGNOSIS — F329 Major depressive disorder, single episode, unspecified: Secondary | ICD-10-CM | POA: Diagnosis not present

## 2019-05-13 DIAGNOSIS — R188 Other ascites: Secondary | ICD-10-CM | POA: Diagnosis not present

## 2019-05-13 DIAGNOSIS — R600 Localized edema: Secondary | ICD-10-CM | POA: Diagnosis not present

## 2019-05-13 DIAGNOSIS — R634 Abnormal weight loss: Secondary | ICD-10-CM | POA: Diagnosis not present

## 2019-05-13 DIAGNOSIS — R809 Proteinuria, unspecified: Secondary | ICD-10-CM | POA: Diagnosis not present

## 2019-05-14 DIAGNOSIS — C801 Malignant (primary) neoplasm, unspecified: Secondary | ICD-10-CM | POA: Diagnosis not present

## 2019-05-14 DIAGNOSIS — J90 Pleural effusion, not elsewhere classified: Secondary | ICD-10-CM | POA: Diagnosis not present

## 2019-05-16 DIAGNOSIS — R9431 Abnormal electrocardiogram [ECG] [EKG]: Secondary | ICD-10-CM | POA: Diagnosis not present

## 2019-05-16 DIAGNOSIS — J90 Pleural effusion, not elsewhere classified: Secondary | ICD-10-CM | POA: Diagnosis not present

## 2019-05-16 DIAGNOSIS — R634 Abnormal weight loss: Secondary | ICD-10-CM | POA: Diagnosis not present

## 2019-05-16 DIAGNOSIS — R000 Tachycardia, unspecified: Secondary | ICD-10-CM | POA: Diagnosis not present

## 2019-05-16 DIAGNOSIS — R600 Localized edema: Secondary | ICD-10-CM | POA: Diagnosis not present

## 2019-05-16 DIAGNOSIS — D649 Anemia, unspecified: Secondary | ICD-10-CM | POA: Diagnosis not present

## 2019-05-16 DIAGNOSIS — C799 Secondary malignant neoplasm of unspecified site: Secondary | ICD-10-CM | POA: Diagnosis not present

## 2019-05-16 DIAGNOSIS — R809 Proteinuria, unspecified: Secondary | ICD-10-CM | POA: Diagnosis not present

## 2019-05-16 DIAGNOSIS — C641 Malignant neoplasm of right kidney, except renal pelvis: Secondary | ICD-10-CM | POA: Diagnosis not present

## 2019-05-16 DIAGNOSIS — C801 Malignant (primary) neoplasm, unspecified: Secondary | ICD-10-CM | POA: Diagnosis not present

## 2019-05-16 DIAGNOSIS — R590 Localized enlarged lymph nodes: Secondary | ICD-10-CM | POA: Diagnosis not present

## 2019-05-16 DIAGNOSIS — R1900 Intra-abdominal and pelvic swelling, mass and lump, unspecified site: Secondary | ICD-10-CM | POA: Diagnosis not present

## 2019-05-16 DIAGNOSIS — C770 Secondary and unspecified malignant neoplasm of lymph nodes of head, face and neck: Secondary | ICD-10-CM | POA: Diagnosis not present

## 2019-05-16 DIAGNOSIS — R609 Edema, unspecified: Secondary | ICD-10-CM | POA: Diagnosis not present

## 2019-05-16 DIAGNOSIS — Z85528 Personal history of other malignant neoplasm of kidney: Secondary | ICD-10-CM | POA: Diagnosis not present

## 2019-05-16 DIAGNOSIS — R319 Hematuria, unspecified: Secondary | ICD-10-CM | POA: Diagnosis not present

## 2019-05-16 DIAGNOSIS — M7989 Other specified soft tissue disorders: Secondary | ICD-10-CM | POA: Diagnosis not present

## 2019-05-16 DIAGNOSIS — D509 Iron deficiency anemia, unspecified: Secondary | ICD-10-CM | POA: Diagnosis not present

## 2019-05-18 DIAGNOSIS — C799 Secondary malignant neoplasm of unspecified site: Secondary | ICD-10-CM | POA: Diagnosis not present

## 2019-05-18 DIAGNOSIS — C641 Malignant neoplasm of right kidney, except renal pelvis: Secondary | ICD-10-CM | POA: Diagnosis not present

## 2019-05-18 DIAGNOSIS — C770 Secondary and unspecified malignant neoplasm of lymph nodes of head, face and neck: Secondary | ICD-10-CM | POA: Diagnosis not present

## 2019-05-18 DIAGNOSIS — R600 Localized edema: Secondary | ICD-10-CM | POA: Diagnosis not present

## 2019-05-18 DIAGNOSIS — R52 Pain, unspecified: Secondary | ICD-10-CM | POA: Diagnosis not present

## 2019-05-18 DIAGNOSIS — R1909 Other intra-abdominal and pelvic swelling, mass and lump: Secondary | ICD-10-CM | POA: Diagnosis not present

## 2019-05-18 DIAGNOSIS — R809 Proteinuria, unspecified: Secondary | ICD-10-CM | POA: Diagnosis not present

## 2019-05-18 DIAGNOSIS — R000 Tachycardia, unspecified: Secondary | ICD-10-CM | POA: Diagnosis not present

## 2019-05-18 DIAGNOSIS — R1900 Intra-abdominal and pelvic swelling, mass and lump, unspecified site: Secondary | ICD-10-CM | POA: Diagnosis not present

## 2019-05-18 DIAGNOSIS — Z515 Encounter for palliative care: Secondary | ICD-10-CM | POA: Diagnosis not present

## 2019-05-18 DIAGNOSIS — R319 Hematuria, unspecified: Secondary | ICD-10-CM | POA: Diagnosis not present

## 2019-05-19 DIAGNOSIS — J918 Pleural effusion in other conditions classified elsewhere: Secondary | ICD-10-CM | POA: Diagnosis not present

## 2019-05-19 DIAGNOSIS — R599 Enlarged lymph nodes, unspecified: Secondary | ICD-10-CM | POA: Diagnosis not present

## 2019-05-19 DIAGNOSIS — I313 Pericardial effusion (noninflammatory): Secondary | ICD-10-CM | POA: Diagnosis not present

## 2019-05-19 DIAGNOSIS — R52 Pain, unspecified: Secondary | ICD-10-CM | POA: Diagnosis not present

## 2019-05-19 DIAGNOSIS — C770 Secondary and unspecified malignant neoplasm of lymph nodes of head, face and neck: Secondary | ICD-10-CM | POA: Diagnosis not present

## 2019-05-19 DIAGNOSIS — R634 Abnormal weight loss: Secondary | ICD-10-CM | POA: Diagnosis not present

## 2019-05-19 DIAGNOSIS — Z515 Encounter for palliative care: Secondary | ICD-10-CM | POA: Diagnosis not present

## 2019-05-19 DIAGNOSIS — C649 Malignant neoplasm of unspecified kidney, except renal pelvis: Secondary | ICD-10-CM | POA: Diagnosis not present

## 2019-05-19 DIAGNOSIS — R1900 Intra-abdominal and pelvic swelling, mass and lump, unspecified site: Secondary | ICD-10-CM | POA: Diagnosis not present

## 2019-05-19 DIAGNOSIS — C7800 Secondary malignant neoplasm of unspecified lung: Secondary | ICD-10-CM | POA: Diagnosis not present

## 2019-05-19 DIAGNOSIS — R918 Other nonspecific abnormal finding of lung field: Secondary | ICD-10-CM | POA: Diagnosis not present

## 2019-05-19 DIAGNOSIS — R600 Localized edema: Secondary | ICD-10-CM | POA: Diagnosis not present

## 2019-05-19 DIAGNOSIS — C641 Malignant neoplasm of right kidney, except renal pelvis: Secondary | ICD-10-CM | POA: Diagnosis not present

## 2019-05-19 DIAGNOSIS — I871 Compression of vein: Secondary | ICD-10-CM | POA: Diagnosis not present

## 2019-05-25 DIAGNOSIS — C641 Malignant neoplasm of right kidney, except renal pelvis: Secondary | ICD-10-CM | POA: Diagnosis not present

## 2019-05-25 DIAGNOSIS — C7800 Secondary malignant neoplasm of unspecified lung: Secondary | ICD-10-CM | POA: Diagnosis not present

## 2019-05-25 DIAGNOSIS — D630 Anemia in neoplastic disease: Secondary | ICD-10-CM | POA: Diagnosis not present

## 2019-05-25 DIAGNOSIS — G893 Neoplasm related pain (acute) (chronic): Secondary | ICD-10-CM | POA: Diagnosis not present

## 2019-05-26 ENCOUNTER — Telehealth: Payer: Self-pay | Admitting: Medical

## 2019-05-26 NOTE — Telephone Encounter (Signed)
I have some records from [facility] with his name. But I can find information in care every where. Will you call him and see if he was evaluated by them on 05/12/2019. How is he doing.

## 2019-05-30 DIAGNOSIS — C641 Malignant neoplasm of right kidney, except renal pelvis: Secondary | ICD-10-CM | POA: Diagnosis not present

## 2019-05-30 DIAGNOSIS — F900 Attention-deficit hyperactivity disorder, predominantly inattentive type: Secondary | ICD-10-CM | POA: Diagnosis not present

## 2019-05-30 DIAGNOSIS — F411 Generalized anxiety disorder: Secondary | ICD-10-CM | POA: Diagnosis not present

## 2019-05-30 DIAGNOSIS — C7801 Secondary malignant neoplasm of right lung: Secondary | ICD-10-CM | POA: Diagnosis not present

## 2019-05-30 DIAGNOSIS — F3181 Bipolar II disorder: Secondary | ICD-10-CM | POA: Diagnosis not present

## 2019-05-30 DIAGNOSIS — C7802 Secondary malignant neoplasm of left lung: Secondary | ICD-10-CM | POA: Diagnosis not present

## 2019-06-02 DIAGNOSIS — C641 Malignant neoplasm of right kidney, except renal pelvis: Secondary | ICD-10-CM | POA: Diagnosis not present

## 2019-06-02 DIAGNOSIS — C7801 Secondary malignant neoplasm of right lung: Secondary | ICD-10-CM | POA: Diagnosis not present

## 2019-06-02 DIAGNOSIS — C7802 Secondary malignant neoplasm of left lung: Secondary | ICD-10-CM | POA: Diagnosis not present

## 2019-06-03 DIAGNOSIS — Z79899 Other long term (current) drug therapy: Secondary | ICD-10-CM | POA: Diagnosis not present

## 2019-06-03 DIAGNOSIS — Z5112 Encounter for antineoplastic immunotherapy: Secondary | ICD-10-CM | POA: Diagnosis not present

## 2019-06-03 DIAGNOSIS — C7800 Secondary malignant neoplasm of unspecified lung: Secondary | ICD-10-CM | POA: Diagnosis not present

## 2019-06-03 DIAGNOSIS — C778 Secondary and unspecified malignant neoplasm of lymph nodes of multiple regions: Secondary | ICD-10-CM | POA: Diagnosis not present

## 2019-06-03 DIAGNOSIS — C641 Malignant neoplasm of right kidney, except renal pelvis: Secondary | ICD-10-CM | POA: Diagnosis not present

## 2019-06-03 DIAGNOSIS — C7802 Secondary malignant neoplasm of left lung: Secondary | ICD-10-CM | POA: Diagnosis not present

## 2019-06-03 DIAGNOSIS — C7801 Secondary malignant neoplasm of right lung: Secondary | ICD-10-CM | POA: Diagnosis not present

## 2019-06-03 DIAGNOSIS — C772 Secondary and unspecified malignant neoplasm of intra-abdominal lymph nodes: Secondary | ICD-10-CM | POA: Diagnosis not present

## 2019-06-04 ENCOUNTER — Telehealth: Payer: Self-pay | Admitting: Medical

## 2019-06-04 NOTE — Telephone Encounter (Signed)
Reviewed pt chart today. Got paperwork earlier from hospital but did not see see full work up  in epic. After reviewing ED and oncologist notes did call patient to reach out and discuss recent dx. Let him know office visits or virtual visits available if issues arise. If he needs anything ask him to call me.

## 2019-06-08 DIAGNOSIS — K1230 Oral mucositis (ulcerative), unspecified: Secondary | ICD-10-CM | POA: Diagnosis not present

## 2019-06-09 NOTE — Telephone Encounter (Signed)
See phone note from 06/04/19.

## 2019-06-10 DIAGNOSIS — C772 Secondary and unspecified malignant neoplasm of intra-abdominal lymph nodes: Secondary | ICD-10-CM | POA: Diagnosis not present

## 2019-06-10 DIAGNOSIS — C7800 Secondary malignant neoplasm of unspecified lung: Secondary | ICD-10-CM | POA: Diagnosis not present

## 2019-06-10 DIAGNOSIS — Z9221 Personal history of antineoplastic chemotherapy: Secondary | ICD-10-CM | POA: Diagnosis not present

## 2019-06-10 DIAGNOSIS — C7802 Secondary malignant neoplasm of left lung: Secondary | ICD-10-CM | POA: Diagnosis not present

## 2019-06-10 DIAGNOSIS — C778 Secondary and unspecified malignant neoplasm of lymph nodes of multiple regions: Secondary | ICD-10-CM | POA: Diagnosis not present

## 2019-06-10 DIAGNOSIS — K1230 Oral mucositis (ulcerative), unspecified: Secondary | ICD-10-CM | POA: Diagnosis not present

## 2019-06-10 DIAGNOSIS — C641 Malignant neoplasm of right kidney, except renal pelvis: Secondary | ICD-10-CM | POA: Diagnosis not present

## 2019-06-10 DIAGNOSIS — C7801 Secondary malignant neoplasm of right lung: Secondary | ICD-10-CM | POA: Diagnosis not present

## 2019-06-17 DIAGNOSIS — C641 Malignant neoplasm of right kidney, except renal pelvis: Secondary | ICD-10-CM | POA: Diagnosis not present

## 2019-06-17 DIAGNOSIS — C7800 Secondary malignant neoplasm of unspecified lung: Secondary | ICD-10-CM | POA: Diagnosis not present

## 2019-06-24 DIAGNOSIS — C772 Secondary and unspecified malignant neoplasm of intra-abdominal lymph nodes: Secondary | ICD-10-CM | POA: Diagnosis not present

## 2019-06-24 DIAGNOSIS — Z5112 Encounter for antineoplastic immunotherapy: Secondary | ICD-10-CM | POA: Diagnosis not present

## 2019-06-24 DIAGNOSIS — C641 Malignant neoplasm of right kidney, except renal pelvis: Secondary | ICD-10-CM | POA: Diagnosis not present

## 2019-06-24 DIAGNOSIS — C7801 Secondary malignant neoplasm of right lung: Secondary | ICD-10-CM | POA: Diagnosis not present

## 2019-06-24 DIAGNOSIS — C7800 Secondary malignant neoplasm of unspecified lung: Secondary | ICD-10-CM | POA: Diagnosis not present

## 2019-06-24 DIAGNOSIS — C7802 Secondary malignant neoplasm of left lung: Secondary | ICD-10-CM | POA: Diagnosis not present

## 2019-06-24 DIAGNOSIS — Z5111 Encounter for antineoplastic chemotherapy: Secondary | ICD-10-CM | POA: Diagnosis not present

## 2019-06-24 DIAGNOSIS — Z79899 Other long term (current) drug therapy: Secondary | ICD-10-CM | POA: Diagnosis not present

## 2019-07-11 ENCOUNTER — Other Ambulatory Visit: Payer: Self-pay

## 2019-07-11 ENCOUNTER — Ambulatory Visit (INDEPENDENT_AMBULATORY_CARE_PROVIDER_SITE_OTHER): Payer: BLUE CROSS/BLUE SHIELD | Admitting: Medical

## 2019-07-11 VITALS — BP 125/90 | Wt 124.0 lb

## 2019-07-11 DIAGNOSIS — C649 Malignant neoplasm of unspecified kidney, except renal pelvis: Secondary | ICD-10-CM

## 2019-07-11 DIAGNOSIS — R000 Tachycardia, unspecified: Secondary | ICD-10-CM | POA: Diagnosis not present

## 2019-07-11 DIAGNOSIS — D649 Anemia, unspecified: Secondary | ICD-10-CM

## 2019-07-11 DIAGNOSIS — I10 Essential (primary) hypertension: Secondary | ICD-10-CM

## 2019-07-11 MED ORDER — DILTIAZEM HCL ER COATED BEADS 180 MG PO CP24: 180.0000 mg | ORAL_CAPSULE | Freq: Every day | ORAL | 2 refills | Status: AC

## 2019-07-11 MED ORDER — METOPROLOL SUCCINATE ER 25 MG PO TB24: 25.0000 mg | ORAL_TABLET | Freq: Once | ORAL | 2 refills | Status: AC

## 2019-07-11 MED ORDER — FOLIC ACID 1 MG PO TABS: 1.0000 mg | ORAL_TABLET | Freq: Every day | ORAL | 11 refills | Status: AC

## 2019-07-11 NOTE — Progress Notes (Signed)
Subjective:    Patient ID: Jon Reyes, male    DOB: 1961-02-25, 59 y.o.   MRN: [id number]  HPI  Virtual Visit via Telephone Note  I connected with Jon Reyes on 07/11/19 at  1:00 PM EST by telephone and verified that I am speaking with the correct person using two identifiers.  Asking to call back with vitals.  Location: Patient: home Provider: office   I discussed the limitations, risks, security and privacy concerns of performing an evaluation and management service by telephone and the availability of in person appointments. I also discussed with the patient that there may be a patient responsible charge related to this service. The patient expressed understanding and agreed to proceed.   History of Present Illness:  Pt states he is feeling good today. States feeling some shortness of breath when he climbs stairs for months. No arms swelling and no leg swelling or calf pain. Pt has been sob since renal cancer dx. Pt had chest imaging.   Pt has known renal cell cancer.  Pt specialist told him to contact me for some meds that might be running me.  He is on  Metoprolol 25 mg daily. Also on oxycodone 5 mg every 4 hours as needed for pain.  Folic acid 1 mg daily. Diltiazem 180 mg daily  Pt was given b blocker and diltiazem. This was [facility] by [facility]. Diltiazem dose was adjusted per by [facility] per [facility].   Diagnoses: 1. Metastatic poorly differentiated carcinoma consistent with metastatic renal cell carcinoma, biopsy-proven left supraclavicular lymph node. Primary likely right kidney. Clinical metastatic disease to bilateral lungs, mediastinal lymph nodes, and retroperitoneal lymph nodes.  Treatment: 1. Pembrolizumab 200 mg IV every 21 days plus Axitinib 5 mg twice daily, cycle 1 day 1 equals 06/03/2019. Status post cycle 1.   Hematologic/Oncologic History: Jon Reyes is a 59 y.o. male who is seen in consultation at the request of Self, A Referral for  an evaluation of metastatic renal cell carcinoma, original date of consultation 05/19/2019.  Jon Reyes presented to [facility] on 05/13/2019 with complaints of bilateral lower extremity edema as well as swelling in his penis and testicles. Much of the swelling in his lower extremities was in his thighs. He had also noted 20 pound unintentional weight loss over a few months. Initially, he felt the weight loss was due to grief after the death of his son. He endorsed a poor appetite as well as mid abdominal pain.  CT of the abdomen and pelvis performed on 05/13/2019 revealed an extensive mass with associated adenopathy throughout the right retroperitoneum measuring 9.5 x 6.3 cm. Radiology could not tell if the mass was arising from the head of the pancreas and extended superiorly, inferiorly and laterally versus adenopathy which invaded the head and body of the pancreas. It was also felt the mass could conceivably be arising from the right adrenal gland. There was adenopathy which engulfed the right adrenal gland. There was also a possibility that the mass could arise somewhat eccentric Jon Reyes from the right kidney medially although the appearance was more suspicious of mass/adenopathy abutting the right kidney medially. The mass/adenopathy complex in the right retroperitoneum obstructed the right proximal ureter with severe hydronephrosis on the right. There was obstruction of the right renal vein with diffuse enlargement and edema of the right kidney. The tumor engulfed and narrowed but did not completely obstruct the right renal artery. 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 The apparent adenopathy extended inferior to the aorta into the left retroperitoneum. Right pleural effusion was seen. Nodular opacity in this area measured just over 1 center possibly represented metastasis superimposed and pneumonia. Bilateral sacroiliitis was also seen.  Dr. Manuella Ghazi called me that  Friday afternoon as I was on call requesting assistance regarding further work-up. After reviewing the imaging, I recommended CT of the chest with contrast. This was performed on 05/14/2019. Multiple bilateral solid pulmonary nodules suspicious for metastatic disease was seen. An enlarged left supraclavicular, left hilar and paratracheal lymph nodes suspicious for metastatic disease was also noted.  He underwent an ultrasound-guided biopsy of the left supraclavicular lymph node on 05/16/2019. Pathology revealed metastatic poorly differentiated carcinoma with abundant necrosis consistent with renal origin. The biopsy consisted of multiple fragments of lymph node tissue mostly replaced by a proliferation of malignant cells with abundant eosinophilic and focally clear cytoplasm and abundant associated necrosis. The tumor cells were positive for pancytokeratin, vimentin, CK7 and CD10 immunohistochemical stains. The tumor cells were negative for CK20, CDX2, Mart-1 and TTF-1 immunohistochemical stains.  Nuclear medicine bone scan performed on 05/16/2019 failed to reveal any evidence for metastatic disease.  Interim Note:  Jon Reyes returns today for follow up of stage IV clear-cell renal cell carcinoma.  Overall, he feels like he is slowly doing better. He denies any cough or shortness of breath. No diarrhea. No mouth sores or sore throat. Appetite is slowly improving.    Assessment and Plan:  1. 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 Maintain full dosing of axitinib as he is tolerating this much better. Continue pembrolizumab with cycle 2 today. I reviewed his orders, signed them and approve them for use. There may be some early signs of clinical response with what appears to be disappearance of this left supraclavicular lymph node. The right flank mass remains stable in size. Plan will be to see him back in  3 weeks prior to cycle 3. As long as there is no evidence of disease progression in the interim, we will reimage him 9 weeks from now. Goals of treatment are to prolong life but to do so in a meaningful way. If he has a meaningful clinical response with improvement in his nutrition and performance status, we may consider palliative resection of his right-sided primary.  In the interim, he was encouraged to call if questions, concerns, or problems arise.  Pt seeing hematologist next Friday,       Observations/Objective: General- no acute distress, pleasant, alert and oriented.  Assessment and Plan: For renal cell carcinoma continue to follow up with oncologist.  For anemia, will follow labs done by heme/oncologist on Friday.  Hx of htn and tachycardia. Please wife to check bp, pulse and 02 sat. Send [place] update by my chart.  For hx of dyspnea since CA dx. Ct of chest in past did not show PE. But notify us if worsening sob and want you to watch your 02 sat. Should remain above 96%.  Chronic pain relate to cancer. Refill oxycodone when due. Asking staff to get you scheled for urine drug screen and to sign contract.  Follow up 1 month or as needed  Follow Up Instructions:    I discussed the assessment and treatment plan with the patient. The patient was provided an opportunity to ask questions and all were answered. The patient agreed with the plan and demonstrated an understanding of  the instructions.   The patient was advised to call back or seek an in-person evaluation if the symptoms worsen or if the condition fails to improve as anticipated.  I provided 30+  minutes of non-face-to-face time during this encounter.   Mackie Pai, PA-C    Review of Systems     Objective:   Physical Exam        Assessment & Plan:

## 2019-07-11 NOTE — Patient Instructions (Addendum)
For renal cell carcinoma continue to follow up with oncologist.  For anemia, will follow labs done by heme/oncologist on Friday.  Hx of htn and tachycardia. Please wife to check bp, pulse and 02 sat. Send [place] update by my chart.  For hx of dyspnea since CA dx. Ct of chest in past did not show PE. But notify us if worsening sob and want you to watch your 02 sat. Should remain above 96%.  Chronic pain relate to cancer. Refill oxycodone when due. Asking staff to get you scheled for urine drug screen and to sign contract.  Follow up 1 month or as needed

## 2019-07-13 ENCOUNTER — Encounter: Payer: Self-pay | Admitting: Medical

## 2019-07-15 DIAGNOSIS — C7800 Secondary malignant neoplasm of unspecified lung: Secondary | ICD-10-CM | POA: Diagnosis not present

## 2019-07-15 DIAGNOSIS — C7802 Secondary malignant neoplasm of left lung: Secondary | ICD-10-CM | POA: Diagnosis not present

## 2019-07-15 DIAGNOSIS — Z5112 Encounter for antineoplastic immunotherapy: Secondary | ICD-10-CM | POA: Diagnosis not present

## 2019-07-15 DIAGNOSIS — C778 Secondary and unspecified malignant neoplasm of lymph nodes of multiple regions: Secondary | ICD-10-CM | POA: Diagnosis not present

## 2019-07-15 DIAGNOSIS — C641 Malignant neoplasm of right kidney, except renal pelvis: Secondary | ICD-10-CM | POA: Diagnosis not present

## 2019-07-15 DIAGNOSIS — R0602 Shortness of breath: Secondary | ICD-10-CM | POA: Diagnosis not present

## 2019-07-15 DIAGNOSIS — Z79899 Other long term (current) drug therapy: Secondary | ICD-10-CM | POA: Diagnosis not present

## 2019-07-15 DIAGNOSIS — C7801 Secondary malignant neoplasm of right lung: Secondary | ICD-10-CM | POA: Diagnosis not present

## 2019-07-15 DIAGNOSIS — R109 Unspecified abdominal pain: Secondary | ICD-10-CM | POA: Diagnosis not present

## 2019-07-20 ENCOUNTER — Encounter: Payer: Self-pay | Admitting: Medical

## 2019-07-21 ENCOUNTER — Encounter: Payer: Self-pay | Admitting: Medical

## 2019-07-22 ENCOUNTER — Other Ambulatory Visit: Payer: 59

## 2019-07-25 ENCOUNTER — Other Ambulatory Visit: Payer: BLUE CROSS/BLUE SHIELD

## 2019-07-25 ENCOUNTER — Ambulatory Visit (HOSPITAL_BASED_OUTPATIENT_CLINIC_OR_DEPARTMENT_OTHER)
Admission: RE | Admit: 2019-07-25 | Discharge: 2019-07-25 | Disposition: A | Discharge status: Home / Self Care | Payer: BLUE CROSS/BLUE SHIELD | Source: Ambulatory Visit | Attending: Medical | Admitting: Medical

## 2019-07-25 ENCOUNTER — Encounter: Payer: Self-pay | Admitting: Medical

## 2019-07-25 ENCOUNTER — Ambulatory Visit (INDEPENDENT_AMBULATORY_CARE_PROVIDER_SITE_OTHER): Payer: BLUE CROSS/BLUE SHIELD | Admitting: Medical

## 2019-07-25 ENCOUNTER — Other Ambulatory Visit: Payer: Self-pay

## 2019-07-25 VITALS — BP 130/93 | HR 97

## 2019-07-25 DIAGNOSIS — D729 Disorder of white blood cells, unspecified: Secondary | ICD-10-CM | POA: Diagnosis not present

## 2019-07-25 DIAGNOSIS — M25512 Pain in left shoulder: Secondary | ICD-10-CM | POA: Diagnosis not present

## 2019-07-25 DIAGNOSIS — M79601 Pain in right arm: Secondary | ICD-10-CM

## 2019-07-25 DIAGNOSIS — M79602 Pain in left arm: Secondary | ICD-10-CM | POA: Diagnosis not present

## 2019-07-25 DIAGNOSIS — M79621 Pain in right upper arm: Secondary | ICD-10-CM | POA: Diagnosis not present

## 2019-07-25 DIAGNOSIS — M79631 Pain in right forearm: Secondary | ICD-10-CM

## 2019-07-25 DIAGNOSIS — M79632 Pain in left forearm: Secondary | ICD-10-CM | POA: Insufficient documentation

## 2019-07-25 DIAGNOSIS — D649 Anemia, unspecified: Secondary | ICD-10-CM | POA: Diagnosis not present

## 2019-07-25 DIAGNOSIS — M25511 Pain in right shoulder: Secondary | ICD-10-CM

## 2019-07-25 DIAGNOSIS — R748 Abnormal levels of other serum enzymes: Secondary | ICD-10-CM

## 2019-07-25 DIAGNOSIS — M79622 Pain in left upper arm: Secondary | ICD-10-CM | POA: Diagnosis not present

## 2019-07-25 LAB — CBC WITH DIFFERENTIAL/PLATELET
Basophils Absolute: 0 10*3/uL (ref 0.0–0.1)
Basophils Relative: 0.8 % (ref 0.0–3.0)
Eosinophils Absolute: 0.2 10*3/uL (ref 0.0–0.7)
Eosinophils Relative: 3.5 % (ref 0.0–5.0)
HCT: 36.1 % — ABNORMAL LOW (ref 39.0–52.0)
Hemoglobin: 11.4 g/dL — ABNORMAL LOW (ref 13.0–17.0)
Lymphocytes Relative: 42.2 % (ref 12.0–46.0)
Lymphs Abs: 2.2 10*3/uL (ref 0.7–4.0)
MCHC: 31.7 g/dL (ref 30.0–36.0)
MCV: 78 fl (ref 78.0–100.0)
Monocytes Absolute: 0.4 10*3/uL (ref 0.1–1.0)
Monocytes Relative: 7.9 % (ref 3.0–12.0)
Neutro Abs: 2.4 10*3/uL (ref 1.4–7.7)
Neutrophils Relative %: 45.6 % (ref 43.0–77.0)
Platelets: 222 10*3/uL (ref 150.0–400.0)
RBC: 4.63 Mil/uL (ref 4.22–5.81)
RDW: 27.1 % — ABNORMAL HIGH (ref 11.5–15.5)
WBC: 5.2 10*3/uL (ref 4.0–10.5)

## 2019-07-25 LAB — COMPREHENSIVE METABOLIC PANEL
ALT: 24 U/L (ref 0–53)
AST: 17 U/L (ref 0–37)
Albumin: 3.7 g/dL (ref 3.5–5.2)
Alkaline Phosphatase: 123 U/L — ABNORMAL HIGH (ref 39–117)
BUN: 22 mg/dL (ref 6–23)
CO2: 25 mEq/L (ref 19–32)
Calcium: 9.5 mg/dL (ref 8.4–10.5)
Chloride: 102 mEq/L (ref 96–112)
Creatinine, Ser: 1.03 mg/dL (ref 0.40–1.50)
GFR: 90.17 mL/min (ref >60.00)
Glucose, Bld: 111 mg/dL — ABNORMAL HIGH (ref 70–99)
Potassium: 4.1 mEq/L (ref 3.5–5.1)
Sodium: 134 mEq/L — ABNORMAL LOW (ref 135–145)
Total Bilirubin: 0.3 mg/dL (ref 0.2–1.2)
Total Protein: 8.8 g/dL — ABNORMAL HIGH (ref 6.0–8.3)

## 2019-07-25 NOTE — Progress Notes (Signed)
   Subjective:    Patient ID: Jon Reyes, male    DOB: Sep 09, 1960, 59 y.o.   MRN: [id number]  HPI  Virtual Visit via Video Note  I connected with [name] on 07/25/19 at 10:20 AM EDT by a video enabled telemedicine application and verified that I am speaking with the correct person using two identifiers.  Location: Patient: home Provider: office   I discussed the limitations of evaluation and management by telemedicine and the availability of in person appointments. The patient expressed understanding and agreed to proceed.   History of Present Illness:   Pt states recently he had pain in both arms and shoulder pain for 2 weeks. Shoulder region pain down both arms. Pain on range of motion both sides. No obvious swelling per.   Pt states dull constant pain worsened with movement.  Pt state his oncologist wanted him to go to ED when he advised them of pain 1-2 weeks ago.   FINDINGS: There is no abnormal osseous uptake to suggest metastatic disease. Foley catheter is in place. Soft tissues demonstrated enlarged appearance of the right kidney and fullness of the right intrarenal collecting system.  IMPRESSION: Negative for osseous metastatic disease.  Pt has elevated alkaline phosphatase on 07/15/2019.  Pt has appointment with oncologist August 08, 2019. Renal cell carcinoma.   Observations/Objective: General-no acute distress, pleasant, oriented. Lungs- on inspection lungs appear unlabored. Neck- no tracheal deviation or jvd on inspection. Neuro- gross motor function appears intact. Upper ext- on inspection upper arms don't look swollen. On range of motion upper ext reports.  Assessment and Plan: Recent bilateral upper ext pain shoulder down to forearms with hx of Renal CA. With these complaints decided to get xray of shoulders, upper extremity and forearms.   Also decided to get bilateral upper extremity US to make sure no dvt seen.   Pt reports daily mild  shortness of breath since diagnosis Cancer dx. No recent change in feeling of shortness of breath and no leg pain or calf swelling reported. Did explain if you get any increased dyspnenea the would get ct chest. Pt expresses understanding and will update me.  cmp and cbc ordered.  Follow up date to be determined after lab review.   May need to update oncologist on pain in upper ext as he had NM body scan in January which came back negative but may need repeat.   35 minutes spent with pt today.  Mackie Pai, PA-C  Follow Up Instructions:    I discussed the assessment and treatment plan with the patient. The patient was provided an opportunity to ask questions and all were answered. The patient agreed with the plan and demonstrated an understanding of the instructions.   The patient was advised to call back or seek an in-person evaluation if the symptoms worsen or if the condition fails to improve as anticipated.  I provided 25  minutes of non-face-to-face time during this encounter.   Mackie Pai, PA-C   Review of Systems     Objective:   Physical Exam        Assessment & Plan:

## 2019-07-25 NOTE — Patient Instructions (Signed)
Recent bilateral upper ext pain shoulder down to forearms with hx of Renal CA. With these complaints decided to get xray of shoulders, upper extremity and forearms.   Also decided to get bilateral upper extremity US to make sure no dvt seen.   Pt reports daily mild shortness of breath since diagnosis Cancer dx. No recent change in feeling of shortness of breath and no leg pain or calf swelling reported. Did explain if you get any increased dyspnenea the would get ct chest. Pt expresses understanding and will update me.  cmp and cbc ordered.  Follow up date to be determined after lab review.   May need to update oncologist on pain in upper ext as he had NM body scan in January which came back negative but may need repeat.

## 2019-07-27 ENCOUNTER — Other Ambulatory Visit: Payer: Self-pay

## 2019-07-27 LAB — PATHOLOGIST SMEAR REVIEW

## 2019-07-28 ENCOUNTER — Encounter: Payer: Self-pay | Admitting: Medical

## 2019-07-28 ENCOUNTER — Other Ambulatory Visit: Payer: Self-pay

## 2019-07-28 ENCOUNTER — Ambulatory Visit (INDEPENDENT_AMBULATORY_CARE_PROVIDER_SITE_OTHER): Payer: BLUE CROSS/BLUE SHIELD | Admitting: Medical

## 2019-07-28 ENCOUNTER — Ambulatory Visit: Payer: BLUE CROSS/BLUE SHIELD | Admitting: Medical

## 2019-07-28 VITALS — BP 130/90 | HR 98 | Temp 98.4°F | Resp 16 | Ht 67.0 in | Wt 137.6 lb

## 2019-07-28 DIAGNOSIS — R748 Abnormal levels of other serum enzymes: Secondary | ICD-10-CM

## 2019-07-28 DIAGNOSIS — D649 Anemia, unspecified: Secondary | ICD-10-CM

## 2019-07-28 LAB — COMPREHENSIVE METABOLIC PANEL
ALT: 21 U/L (ref 0–53)
AST: 15 U/L (ref 0–37)
Albumin: 3.9 g/dL (ref 3.5–5.2)
Alkaline Phosphatase: 126 U/L — ABNORMAL HIGH (ref 39–117)
BUN: 18 mg/dL (ref 6–23)
CO2: 28 mEq/L (ref 19–32)
Calcium: 10 mg/dL (ref 8.4–10.5)
Chloride: 99 mEq/L (ref 96–112)
Creatinine, Ser: 0.98 mg/dL (ref 0.40–1.50)
GFR: 95.5 mL/min (ref >60.00)
Glucose, Bld: 93 mg/dL (ref 70–99)
Potassium: 4.2 mEq/L (ref 3.5–5.1)
Sodium: 134 mEq/L — ABNORMAL LOW (ref 135–145)
Total Bilirubin: 0.3 mg/dL (ref 0.2–1.2)
Total Protein: 9 g/dL — ABNORMAL HIGH (ref 6.0–8.3)

## 2019-07-28 LAB — FERRITIN: Ferritin: 528.2 ng/mL — ABNORMAL HIGH (ref 22.0–322.0)

## 2019-07-28 NOTE — Patient Instructions (Signed)
Your shoulder pain and upper ext pain is less today. Xray negative and [place] negative. Continue oxycodone and I want you to add1  tylenol and low dose 200 mg ibuprofen every 8 hours. We will see if this helps.  For elevated alk phosph will get repeat cmp with isoenzymes. Will try to contact oncologist to see if they feel need to repeat bone scan done in January.  For anemia will get iron studies.  Follow up date to be determined after study review or as needed

## 2019-07-28 NOTE — Addendum Note (Signed)
Addended by: Caffie Pinto on: 07/28/2019 09:51 AM   Modules accepted: Orders

## 2019-07-28 NOTE — Progress Notes (Signed)
Subjective:    Patient ID: Jon Reyes, male    DOB: [birth year], 59 y.o.   MRN: [id number]  HPI  Pt in for follow up.   He states still has pain in shoulder and upper arm. He states less pain now and more in shoulder areas on both sides.   Pt not reporting any neck pain.   Pt had negative x-rays of both shoulder, humerus and forearms. No report of any lytic lesions. Pt has hx of renal CA. Negative NM bone scan in January. His [place] earlier this week both bilateral upper ext were negative.  He has recent blood work showing anemia. Peripheral smear mentioned appears iron deficient.  Pt blood pressure is elevated today on first check. I repeated test.    Review of Systems  Constitutional: Negative for chills, fatigue and fever.  Respiratory: Negative for cough, chest tightness, shortness of breath and wheezing.   Cardiovascular: Negative for chest pain and palpitations.  Gastrointestinal: Negative for abdominal pain.  Musculoskeletal: Negative for back pain, neck pain and neck stiffness.       See hpi.  Neurological: Negative for dizziness, weakness, numbness and headaches.  Hematological: Negative for adenopathy. Does not bruise/bleed easily.  Psychiatric/Behavioral: Negative for behavioral problems and confusion.    Past Medical History:  Diagnosis Date  . Allergy    nasal congestion recently. Seeing allergist tomorrow.  . Asthma   . Depression   . Hemorrhoids   . Hyperthyroidism 02/28/2015     Social History   Socioeconomic History  . Marital status: Single    Spouse name: Not on file  . Number of children: Not on file  . Years of education: Not on file  . Highest education level: Not on file  Occupational History  . Not on file  Tobacco Use  . Smoking status: Never Smoker  . Smokeless tobacco: Never Used  Substance and Sexual Activity  . Alcohol use: Yes    Comment: very rare seldom.  . Drug use: Yes    Types: Marijuana  . Sexual activity: Yes   Birth control/protection: None  Other Topics Concern  . Not on file  Social History Narrative  . Not on file   Social Determinants of Health   Financial Resource Strain:   . Difficulty of Paying Living Expenses:   Food Insecurity:   . Worried About Charity fundraiser in the Last Year:   . Arboriculturist in the Last Year:   Transportation Needs:   . Film/video editor (Medical):   Marland Kitchen Lack of Transportation (Non-Medical):   Physical Activity:   . Days of Exercise per Week:   . Minutes of Exercise per Session:   Stress:   . Feeling of Stress :   Social Connections:   . Frequency of Communication with Friends and Family:   . Frequency of Social Gatherings with Friends and Family:   . Attends Religious Services:   . Active Member of Clubs or Organizations:   . Attends Archivist Meetings:   Marland Kitchen Marital Status:   Intimate Partner Violence:   . Fear of Current or Ex-Partner:   . Emotionally Abused:   Marland Kitchen Physically Abused:   . Sexually Abused:     Past Surgical History:  Procedure Laterality Date  . ABDOMINAL HERNIA REPAIR    . HEMORRHOID SURGERY    . TOE AMPUTATION     right foot    Family History  Problem Relation Age of Onset  .  Heart disease Mother   . Heart disease Father   . Thyroid disease Neg Hx     No Known Allergies  Current Outpatient Medications on File Prior to Visit  Medication Sig Dispense Refill  . ARIPiprazole (ABILIFY) 10 MG tablet Take 10 mg by mouth daily.    . ciprofloxacin (CIPRO) 500 MG tablet Take 1 tablet (500 mg total) by mouth 2 (two) times daily. (Patient not taking: Reported on 12/23/2018) 6 tablet 0  . diltiazem (CARDIZEM CD) 180 MG 24 hr capsule Take 1 capsule (180 mg total) by mouth daily. 30 capsule 2  . fluticasone (FLONASE) 50 MCG/ACT nasal spray Place 2 sprays into both nostrils daily. (Patient not taking: Reported on 9/52/8413) 16 g 1  . folic acid (FOLVITE) 1 MG tablet Take 1 tablet (1 mg total) by mouth daily. 30  tablet 11  . metFORMIN (GLUCOPHAGE) 500 MG tablet Take 1 tablet (500 mg total) by mouth daily with breakfast. 30 tablet 3  . metoprolol succinate (TOPROL-XL) 25 MG 24 hr tablet Take 1 tablet (25 mg total) by mouth once for 1 dose. 30 tablet 2  . oxyCODONE (OXY IR/ROXICODONE) 5 MG immediate release tablet Take by mouth.    . QUEtiapine Fumarate (SEROQUEL XR) 150 MG 24 hr tablet TAKE ONE TABLET BY MOUTH ONE TIME DAILY AT BEDTIME FOR 7 DAYS (DO NOT TAKE WITHIN ONE HOUR OF EATING, TAKE 12 HOURS BEFORE YOU INTEND TO WAK    . sertraline (ZOLOFT) 100 MG tablet Take 1 tablet (100 mg total) by mouth daily. 90 tablet 0  . sildenafil (REVATIO) 20 MG tablet TAKE 2-5 TABLETS BY MOUTH DAILY AS NEEDED. TAKE NO MORE THAN 4 TABLETS IN A 24 HOUR PERIOD 50 tablet 0  . tamsulosin (FLOMAX) 0.4 MG CAPS capsule Take 1 capsule (0.4 mg total) by mouth daily. (Patient not taking: Reported on 07/25/2019) 30 capsule 3  . temazepam (RESTORIL) 30 MG capsule Take 1 capsule (30 mg total) by mouth at bedtime. 30 capsule 1   No current facility-administered medications on file prior to visit.    BP (!) 138/112 (BP Location: Left Arm, Patient Position: Sitting, Cuff Size: Normal)   Pulse 98   Temp 98.4 F (36.9 C) (Temporal)   Resp 16   Ht '5\' 7"'  (1.702 m)   Wt 137 lb 9.6 oz (62.4 kg)   SpO2 100%   BMI 21.55 kg/m       Objective:   Physical Exam   General Mental Status- Alert. General Appearance- Not in acute distress.   Skin General: Color- Normal Color. Moisture- Normal Moisture.  Neck Carotid Arteries- Normal color. Moisture- Normal Moisture. No carotid bruits. No JVD.  Chest and Lung Exam Auscultation: Breath Sounds:-Normal.  Cardiovascular Auscultation:Rythm- Regular. Murmurs & Other Heart Sounds:Auscultation of the heart reveals- No Murmurs.  Abdomen Inspection:-Inspeection Normal. Palpation/Percussion:Note:No mass. Palpation and Percussion of the abdomen reveal- Non Tender, Non Distended + BS, no  rebound or guarding.    Neurologic Cranial Nerve exam:- CN III-XII intact(No nystagmus), symmetric smile. Strength:- 5/5 equal and symmetric strength both upper and lower extremities.  Shoulder- good rom but pt states feels stiff and less range of motion. Some pain on range of motion.  Rt upper ext and left upper ext- no pain on palpation.       Assessment & Plan:  Your shoulder pain and upper ext pain is less today. Xray negative and [place] negative. Continue oxycodone and I want you to add1  tylenol and low dose  200 mg ibuprofen every 8 hours. We will see if this helps.  For elevated alk phosph will get repeat cmp with isoenzymes. Will try to contact oncologist to see if they feel need to repeat bone scan done in January.  For anemia will get iron studies.  Follow up date to be determined after study review or as needed  Time spent with patient today was  30  minutes which consisted of chart rediew, discussing diagnosis, work up treatment and documentation.

## 2019-07-29 LAB — IRON, TOTAL/TOTAL IRON BINDING CAP
%SAT: 24 % (calc) (ref 20–48)
Iron: 83 ug/dL (ref 50–180)
TIBC: 341 mcg/dL (calc) (ref 250–425)

## 2019-08-03 LAB — ALKALINE PHOSPHATASE ISOENZYMES
Alkaline phosphatase (APISO): 128 U/L (ref 35–144)
Bone Isoenzymes: 22 % — ABNORMAL LOW (ref 28–66)
Intestinal Isoenzymes: 2 % (ref 1–24)
Liver Isoenzymes: 76 % — ABNORMAL HIGH (ref 25–69)

## 2019-08-08 DIAGNOSIS — M25512 Pain in left shoulder: Secondary | ICD-10-CM | POA: Diagnosis not present

## 2019-08-08 DIAGNOSIS — C7802 Secondary malignant neoplasm of left lung: Secondary | ICD-10-CM | POA: Diagnosis not present

## 2019-08-08 DIAGNOSIS — M25511 Pain in right shoulder: Secondary | ICD-10-CM | POA: Diagnosis not present

## 2019-08-08 DIAGNOSIS — M6281 Muscle weakness (generalized): Secondary | ICD-10-CM | POA: Diagnosis not present

## 2019-08-08 DIAGNOSIS — C641 Malignant neoplasm of right kidney, except renal pelvis: Secondary | ICD-10-CM | POA: Diagnosis not present

## 2019-08-08 DIAGNOSIS — Z79899 Other long term (current) drug therapy: Secondary | ICD-10-CM | POA: Diagnosis not present

## 2019-08-08 DIAGNOSIS — C772 Secondary and unspecified malignant neoplasm of intra-abdominal lymph nodes: Secondary | ICD-10-CM | POA: Diagnosis not present

## 2019-08-08 DIAGNOSIS — Z5112 Encounter for antineoplastic immunotherapy: Secondary | ICD-10-CM | POA: Diagnosis not present

## 2019-08-08 DIAGNOSIS — M791 Myalgia, unspecified site: Secondary | ICD-10-CM | POA: Diagnosis not present

## 2019-08-08 DIAGNOSIS — C778 Secondary and unspecified malignant neoplasm of lymph nodes of multiple regions: Secondary | ICD-10-CM | POA: Diagnosis not present

## 2019-08-08 DIAGNOSIS — C7800 Secondary malignant neoplasm of unspecified lung: Secondary | ICD-10-CM | POA: Diagnosis not present

## 2019-08-08 DIAGNOSIS — C7801 Secondary malignant neoplasm of right lung: Secondary | ICD-10-CM | POA: Diagnosis not present

## 2019-08-16 DIAGNOSIS — C7800 Secondary malignant neoplasm of unspecified lung: Secondary | ICD-10-CM | POA: Diagnosis not present

## 2019-08-16 DIAGNOSIS — R2689 Other abnormalities of gait and mobility: Secondary | ICD-10-CM | POA: Diagnosis not present

## 2019-08-16 DIAGNOSIS — C772 Secondary and unspecified malignant neoplasm of intra-abdominal lymph nodes: Secondary | ICD-10-CM | POA: Diagnosis not present

## 2019-08-16 DIAGNOSIS — M47812 Spondylosis without myelopathy or radiculopathy, cervical region: Secondary | ICD-10-CM | POA: Diagnosis not present

## 2019-08-16 DIAGNOSIS — M50222 Other cervical disc displacement at C5-C6 level: Secondary | ICD-10-CM | POA: Diagnosis not present

## 2019-08-16 DIAGNOSIS — C641 Malignant neoplasm of right kidney, except renal pelvis: Secondary | ICD-10-CM | POA: Diagnosis not present

## 2019-08-16 DIAGNOSIS — C649 Malignant neoplasm of unspecified kidney, except renal pelvis: Secondary | ICD-10-CM | POA: Diagnosis not present

## 2019-08-17 DIAGNOSIS — C7802 Secondary malignant neoplasm of left lung: Secondary | ICD-10-CM | POA: Diagnosis not present

## 2019-08-17 DIAGNOSIS — C7801 Secondary malignant neoplasm of right lung: Secondary | ICD-10-CM | POA: Diagnosis not present

## 2019-08-17 DIAGNOSIS — C7931 Secondary malignant neoplasm of brain: Secondary | ICD-10-CM | POA: Diagnosis not present

## 2019-08-17 DIAGNOSIS — C641 Malignant neoplasm of right kidney, except renal pelvis: Secondary | ICD-10-CM | POA: Diagnosis not present

## 2019-08-17 DIAGNOSIS — Z510 Encounter for antineoplastic radiation therapy: Secondary | ICD-10-CM | POA: Diagnosis not present

## 2019-08-18 DIAGNOSIS — C641 Malignant neoplasm of right kidney, except renal pelvis: Secondary | ICD-10-CM | POA: Diagnosis not present

## 2019-08-18 DIAGNOSIS — Z510 Encounter for antineoplastic radiation therapy: Secondary | ICD-10-CM | POA: Diagnosis not present

## 2019-08-18 DIAGNOSIS — C7801 Secondary malignant neoplasm of right lung: Secondary | ICD-10-CM | POA: Diagnosis not present

## 2019-08-18 DIAGNOSIS — C7931 Secondary malignant neoplasm of brain: Secondary | ICD-10-CM | POA: Diagnosis not present

## 2019-08-18 DIAGNOSIS — C7802 Secondary malignant neoplasm of left lung: Secondary | ICD-10-CM | POA: Diagnosis not present

## 2019-08-19 DIAGNOSIS — Z510 Encounter for antineoplastic radiation therapy: Secondary | ICD-10-CM | POA: Diagnosis not present

## 2019-08-19 DIAGNOSIS — C7802 Secondary malignant neoplasm of left lung: Secondary | ICD-10-CM | POA: Diagnosis not present

## 2019-08-19 DIAGNOSIS — C641 Malignant neoplasm of right kidney, except renal pelvis: Secondary | ICD-10-CM | POA: Diagnosis not present

## 2019-08-19 DIAGNOSIS — C7931 Secondary malignant neoplasm of brain: Secondary | ICD-10-CM | POA: Diagnosis not present

## 2019-08-19 DIAGNOSIS — C7801 Secondary malignant neoplasm of right lung: Secondary | ICD-10-CM | POA: Diagnosis not present

## 2019-08-22 DIAGNOSIS — C7801 Secondary malignant neoplasm of right lung: Secondary | ICD-10-CM | POA: Diagnosis not present

## 2019-08-22 DIAGNOSIS — Z510 Encounter for antineoplastic radiation therapy: Secondary | ICD-10-CM | POA: Diagnosis not present

## 2019-08-22 DIAGNOSIS — C641 Malignant neoplasm of right kidney, except renal pelvis: Secondary | ICD-10-CM | POA: Diagnosis not present

## 2019-08-22 DIAGNOSIS — C7802 Secondary malignant neoplasm of left lung: Secondary | ICD-10-CM | POA: Diagnosis not present

## 2019-08-22 DIAGNOSIS — C7931 Secondary malignant neoplasm of brain: Secondary | ICD-10-CM | POA: Diagnosis not present

## 2019-08-23 DIAGNOSIS — C641 Malignant neoplasm of right kidney, except renal pelvis: Secondary | ICD-10-CM | POA: Diagnosis not present

## 2019-08-23 DIAGNOSIS — C7802 Secondary malignant neoplasm of left lung: Secondary | ICD-10-CM | POA: Diagnosis not present

## 2019-08-23 DIAGNOSIS — Z510 Encounter for antineoplastic radiation therapy: Secondary | ICD-10-CM | POA: Diagnosis not present

## 2019-08-23 DIAGNOSIS — C7931 Secondary malignant neoplasm of brain: Secondary | ICD-10-CM | POA: Diagnosis not present

## 2019-08-23 DIAGNOSIS — C7801 Secondary malignant neoplasm of right lung: Secondary | ICD-10-CM | POA: Diagnosis not present

## 2019-08-24 DIAGNOSIS — C7931 Secondary malignant neoplasm of brain: Secondary | ICD-10-CM | POA: Diagnosis not present

## 2019-08-24 DIAGNOSIS — C7801 Secondary malignant neoplasm of right lung: Secondary | ICD-10-CM | POA: Diagnosis not present

## 2019-08-24 DIAGNOSIS — C7802 Secondary malignant neoplasm of left lung: Secondary | ICD-10-CM | POA: Diagnosis not present

## 2019-08-24 DIAGNOSIS — Z510 Encounter for antineoplastic radiation therapy: Secondary | ICD-10-CM | POA: Diagnosis not present

## 2019-08-24 DIAGNOSIS — C641 Malignant neoplasm of right kidney, except renal pelvis: Secondary | ICD-10-CM | POA: Diagnosis not present

## 2019-08-25 DIAGNOSIS — C7802 Secondary malignant neoplasm of left lung: Secondary | ICD-10-CM | POA: Diagnosis not present

## 2019-08-25 DIAGNOSIS — C7931 Secondary malignant neoplasm of brain: Secondary | ICD-10-CM | POA: Diagnosis not present

## 2019-08-25 DIAGNOSIS — C7801 Secondary malignant neoplasm of right lung: Secondary | ICD-10-CM | POA: Diagnosis not present

## 2019-08-25 DIAGNOSIS — C641 Malignant neoplasm of right kidney, except renal pelvis: Secondary | ICD-10-CM | POA: Diagnosis not present

## 2019-08-25 DIAGNOSIS — Z510 Encounter for antineoplastic radiation therapy: Secondary | ICD-10-CM | POA: Diagnosis not present

## 2019-08-26 DIAGNOSIS — C641 Malignant neoplasm of right kidney, except renal pelvis: Secondary | ICD-10-CM | POA: Diagnosis not present

## 2019-08-26 DIAGNOSIS — C7801 Secondary malignant neoplasm of right lung: Secondary | ICD-10-CM | POA: Diagnosis not present

## 2019-08-26 DIAGNOSIS — R918 Other nonspecific abnormal finding of lung field: Secondary | ICD-10-CM | POA: Diagnosis not present

## 2019-08-26 DIAGNOSIS — Z510 Encounter for antineoplastic radiation therapy: Secondary | ICD-10-CM | POA: Diagnosis not present

## 2019-08-26 DIAGNOSIS — C7800 Secondary malignant neoplasm of unspecified lung: Secondary | ICD-10-CM | POA: Diagnosis not present

## 2019-08-26 DIAGNOSIS — C7802 Secondary malignant neoplasm of left lung: Secondary | ICD-10-CM | POA: Diagnosis not present

## 2019-08-26 DIAGNOSIS — C649 Malignant neoplasm of unspecified kidney, except renal pelvis: Secondary | ICD-10-CM | POA: Diagnosis not present

## 2019-08-26 DIAGNOSIS — C7931 Secondary malignant neoplasm of brain: Secondary | ICD-10-CM | POA: Diagnosis not present

## 2019-08-29 DIAGNOSIS — C7801 Secondary malignant neoplasm of right lung: Secondary | ICD-10-CM | POA: Diagnosis not present

## 2019-08-29 DIAGNOSIS — F4312 Post-traumatic stress disorder, chronic: Secondary | ICD-10-CM | POA: Diagnosis not present

## 2019-08-29 DIAGNOSIS — F411 Generalized anxiety disorder: Secondary | ICD-10-CM | POA: Diagnosis not present

## 2019-08-29 DIAGNOSIS — C7802 Secondary malignant neoplasm of left lung: Secondary | ICD-10-CM | POA: Diagnosis not present

## 2019-08-29 DIAGNOSIS — F3181 Bipolar II disorder: Secondary | ICD-10-CM | POA: Diagnosis not present

## 2019-08-29 DIAGNOSIS — Z79899 Other long term (current) drug therapy: Secondary | ICD-10-CM | POA: Diagnosis not present

## 2019-08-29 DIAGNOSIS — C641 Malignant neoplasm of right kidney, except renal pelvis: Secondary | ICD-10-CM | POA: Diagnosis not present

## 2019-08-29 DIAGNOSIS — C7931 Secondary malignant neoplasm of brain: Secondary | ICD-10-CM | POA: Diagnosis not present

## 2019-08-29 DIAGNOSIS — M791 Myalgia, unspecified site: Secondary | ICD-10-CM | POA: Diagnosis not present

## 2019-08-29 DIAGNOSIS — Z510 Encounter for antineoplastic radiation therapy: Secondary | ICD-10-CM | POA: Diagnosis not present

## 2019-08-29 DIAGNOSIS — D509 Iron deficiency anemia, unspecified: Secondary | ICD-10-CM | POA: Diagnosis not present

## 2019-08-30 DIAGNOSIS — C7802 Secondary malignant neoplasm of left lung: Secondary | ICD-10-CM | POA: Diagnosis not present

## 2019-08-30 DIAGNOSIS — C7801 Secondary malignant neoplasm of right lung: Secondary | ICD-10-CM | POA: Diagnosis not present

## 2019-08-30 DIAGNOSIS — C641 Malignant neoplasm of right kidney, except renal pelvis: Secondary | ICD-10-CM | POA: Diagnosis not present

## 2019-08-30 DIAGNOSIS — Z510 Encounter for antineoplastic radiation therapy: Secondary | ICD-10-CM | POA: Diagnosis not present

## 2019-08-30 DIAGNOSIS — C7931 Secondary malignant neoplasm of brain: Secondary | ICD-10-CM | POA: Diagnosis not present

## 2019-08-31 DIAGNOSIS — C7801 Secondary malignant neoplasm of right lung: Secondary | ICD-10-CM | POA: Diagnosis not present

## 2019-08-31 DIAGNOSIS — C7931 Secondary malignant neoplasm of brain: Secondary | ICD-10-CM | POA: Diagnosis not present

## 2019-08-31 DIAGNOSIS — Z510 Encounter for antineoplastic radiation therapy: Secondary | ICD-10-CM | POA: Diagnosis not present

## 2019-08-31 DIAGNOSIS — C7802 Secondary malignant neoplasm of left lung: Secondary | ICD-10-CM | POA: Diagnosis not present

## 2019-08-31 DIAGNOSIS — C641 Malignant neoplasm of right kidney, except renal pelvis: Secondary | ICD-10-CM | POA: Diagnosis not present

## 2019-09-24 DIAGNOSIS — C641 Malignant neoplasm of right kidney, except renal pelvis: Secondary | ICD-10-CM | POA: Diagnosis not present

## 2019-09-26 DIAGNOSIS — Z923 Personal history of irradiation: Secondary | ICD-10-CM | POA: Diagnosis not present

## 2019-09-26 DIAGNOSIS — C7802 Secondary malignant neoplasm of left lung: Secondary | ICD-10-CM | POA: Diagnosis not present

## 2019-09-26 DIAGNOSIS — Z79899 Other long term (current) drug therapy: Secondary | ICD-10-CM | POA: Diagnosis not present

## 2019-09-26 DIAGNOSIS — C7801 Secondary malignant neoplasm of right lung: Secondary | ICD-10-CM | POA: Diagnosis not present

## 2019-09-26 DIAGNOSIS — Z7952 Long term (current) use of systemic steroids: Secondary | ICD-10-CM | POA: Diagnosis not present

## 2019-09-26 DIAGNOSIS — D649 Anemia, unspecified: Secondary | ICD-10-CM | POA: Diagnosis not present

## 2019-09-26 DIAGNOSIS — C772 Secondary and unspecified malignant neoplasm of intra-abdominal lymph nodes: Secondary | ICD-10-CM | POA: Diagnosis not present

## 2019-09-26 DIAGNOSIS — R609 Edema, unspecified: Secondary | ICD-10-CM | POA: Diagnosis not present

## 2019-09-26 DIAGNOSIS — C7931 Secondary malignant neoplasm of brain: Secondary | ICD-10-CM | POA: Diagnosis not present

## 2019-09-26 DIAGNOSIS — C641 Malignant neoplasm of right kidney, except renal pelvis: Secondary | ICD-10-CM | POA: Diagnosis not present

## 2019-10-07 DIAGNOSIS — C649 Malignant neoplasm of unspecified kidney, except renal pelvis: Secondary | ICD-10-CM | POA: Diagnosis not present

## 2019-10-07 DIAGNOSIS — C7931 Secondary malignant neoplasm of brain: Secondary | ICD-10-CM | POA: Diagnosis not present

## 2019-10-07 DIAGNOSIS — R9082 White matter disease, unspecified: Secondary | ICD-10-CM | POA: Diagnosis not present

## 2019-10-13 DIAGNOSIS — C7931 Secondary malignant neoplasm of brain: Secondary | ICD-10-CM | POA: Diagnosis not present

## 2019-10-18 DIAGNOSIS — C7931 Secondary malignant neoplasm of brain: Secondary | ICD-10-CM | POA: Diagnosis not present

## 2019-10-18 DIAGNOSIS — C649 Malignant neoplasm of unspecified kidney, except renal pelvis: Secondary | ICD-10-CM | POA: Diagnosis not present

## 2019-10-18 DIAGNOSIS — Z510 Encounter for antineoplastic radiation therapy: Secondary | ICD-10-CM | POA: Diagnosis not present

## 2019-10-18 DIAGNOSIS — C3490 Malignant neoplasm of unspecified part of unspecified bronchus or lung: Secondary | ICD-10-CM | POA: Diagnosis not present

## 2019-10-19 DIAGNOSIS — D649 Anemia, unspecified: Secondary | ICD-10-CM | POA: Diagnosis not present

## 2019-10-19 DIAGNOSIS — C641 Malignant neoplasm of right kidney, except renal pelvis: Secondary | ICD-10-CM | POA: Diagnosis not present

## 2019-10-19 DIAGNOSIS — C772 Secondary and unspecified malignant neoplasm of intra-abdominal lymph nodes: Secondary | ICD-10-CM | POA: Diagnosis not present

## 2019-10-19 DIAGNOSIS — C7801 Secondary malignant neoplasm of right lung: Secondary | ICD-10-CM | POA: Diagnosis not present

## 2019-10-19 DIAGNOSIS — C7802 Secondary malignant neoplasm of left lung: Secondary | ICD-10-CM | POA: Diagnosis not present

## 2019-10-19 DIAGNOSIS — C7800 Secondary malignant neoplasm of unspecified lung: Secondary | ICD-10-CM | POA: Diagnosis not present

## 2019-10-19 DIAGNOSIS — C7931 Secondary malignant neoplasm of brain: Secondary | ICD-10-CM | POA: Diagnosis not present

## 2019-11-01 DIAGNOSIS — R0602 Shortness of breath: Secondary | ICD-10-CM | POA: Diagnosis not present

## 2019-11-01 DIAGNOSIS — C772 Secondary and unspecified malignant neoplasm of intra-abdominal lymph nodes: Secondary | ICD-10-CM | POA: Diagnosis not present

## 2019-11-01 DIAGNOSIS — R490 Dysphonia: Secondary | ICD-10-CM | POA: Diagnosis not present

## 2019-11-01 DIAGNOSIS — C641 Malignant neoplasm of right kidney, except renal pelvis: Secondary | ICD-10-CM | POA: Diagnosis not present

## 2019-11-01 DIAGNOSIS — C7931 Secondary malignant neoplasm of brain: Secondary | ICD-10-CM | POA: Diagnosis not present

## 2019-11-01 DIAGNOSIS — M545 Low back pain: Secondary | ICD-10-CM | POA: Diagnosis not present

## 2019-11-01 DIAGNOSIS — R590 Localized enlarged lymph nodes: Secondary | ICD-10-CM | POA: Diagnosis not present

## 2019-11-04 DIAGNOSIS — C7970 Secondary malignant neoplasm of unspecified adrenal gland: Secondary | ICD-10-CM | POA: Diagnosis not present

## 2019-11-04 DIAGNOSIS — C7800 Secondary malignant neoplasm of unspecified lung: Secondary | ICD-10-CM | POA: Diagnosis not present

## 2019-11-04 DIAGNOSIS — C641 Malignant neoplasm of right kidney, except renal pelvis: Secondary | ICD-10-CM | POA: Diagnosis not present

## 2019-11-04 DIAGNOSIS — C772 Secondary and unspecified malignant neoplasm of intra-abdominal lymph nodes: Secondary | ICD-10-CM | POA: Diagnosis not present

## 2019-11-04 DIAGNOSIS — Z85528 Personal history of other malignant neoplasm of kidney: Secondary | ICD-10-CM | POA: Diagnosis not present

## 2019-11-04 DIAGNOSIS — R0602 Shortness of breath: Secondary | ICD-10-CM | POA: Diagnosis not present

## 2019-11-06 DIAGNOSIS — C779 Secondary and unspecified malignant neoplasm of lymph node, unspecified: Secondary | ICD-10-CM | POA: Diagnosis not present

## 2019-11-06 DIAGNOSIS — I82C11 Acute embolism and thrombosis of right internal jugular vein: Secondary | ICD-10-CM | POA: Diagnosis not present

## 2019-11-06 DIAGNOSIS — C7971 Secondary malignant neoplasm of right adrenal gland: Secondary | ICD-10-CM | POA: Diagnosis not present

## 2019-11-06 DIAGNOSIS — I82C13 Acute embolism and thrombosis of internal jugular vein, bilateral: Secondary | ICD-10-CM | POA: Diagnosis not present

## 2019-11-06 DIAGNOSIS — R188 Other ascites: Secondary | ICD-10-CM | POA: Diagnosis not present

## 2019-11-06 DIAGNOSIS — R000 Tachycardia, unspecified: Secondary | ICD-10-CM | POA: Diagnosis not present

## 2019-11-06 DIAGNOSIS — J189 Pneumonia, unspecified organism: Secondary | ICD-10-CM | POA: Diagnosis not present

## 2019-11-06 DIAGNOSIS — Z20822 Contact with and (suspected) exposure to covid-19: Secondary | ICD-10-CM | POA: Diagnosis not present

## 2019-11-06 DIAGNOSIS — C7989 Secondary malignant neoplasm of other specified sites: Secondary | ICD-10-CM | POA: Diagnosis not present

## 2019-11-06 DIAGNOSIS — C770 Secondary and unspecified malignant neoplasm of lymph nodes of head, face and neck: Secondary | ICD-10-CM | POA: Diagnosis not present

## 2019-11-06 DIAGNOSIS — C782 Secondary malignant neoplasm of pleura: Secondary | ICD-10-CM | POA: Diagnosis not present

## 2019-11-06 DIAGNOSIS — I2694 Multiple subsegmental pulmonary emboli without acute cor pulmonale: Secondary | ICD-10-CM | POA: Diagnosis not present

## 2019-11-06 DIAGNOSIS — C649 Malignant neoplasm of unspecified kidney, except renal pelvis: Secondary | ICD-10-CM | POA: Diagnosis not present

## 2019-11-06 DIAGNOSIS — J940 Chylous effusion: Secondary | ICD-10-CM | POA: Diagnosis not present

## 2019-11-06 DIAGNOSIS — J910 Malignant pleural effusion: Secondary | ICD-10-CM | POA: Diagnosis not present

## 2019-11-06 DIAGNOSIS — J90 Pleural effusion, not elsewhere classified: Secondary | ICD-10-CM | POA: Diagnosis not present

## 2019-11-06 DIAGNOSIS — Z66 Do not resuscitate: Secondary | ICD-10-CM | POA: Diagnosis not present

## 2019-11-06 DIAGNOSIS — R221 Localized swelling, mass and lump, neck: Secondary | ICD-10-CM | POA: Diagnosis not present

## 2019-11-06 DIAGNOSIS — C641 Malignant neoplasm of right kidney, except renal pelvis: Secondary | ICD-10-CM | POA: Diagnosis not present

## 2019-11-06 DIAGNOSIS — E43 Unspecified severe protein-calorie malnutrition: Secondary | ICD-10-CM | POA: Diagnosis not present

## 2019-11-06 DIAGNOSIS — F329 Major depressive disorder, single episode, unspecified: Secondary | ICD-10-CM | POA: Diagnosis not present

## 2019-11-06 DIAGNOSIS — C7802 Secondary malignant neoplasm of left lung: Secondary | ICD-10-CM | POA: Diagnosis not present

## 2019-11-06 DIAGNOSIS — R0602 Shortness of breath: Secondary | ICD-10-CM | POA: Diagnosis not present

## 2019-11-06 DIAGNOSIS — D849 Immunodeficiency, unspecified: Secondary | ICD-10-CM | POA: Diagnosis not present

## 2019-11-06 DIAGNOSIS — I313 Pericardial effusion (noninflammatory): Secondary | ICD-10-CM | POA: Diagnosis not present

## 2019-11-06 DIAGNOSIS — C7800 Secondary malignant neoplasm of unspecified lung: Secondary | ICD-10-CM | POA: Diagnosis not present

## 2019-11-06 DIAGNOSIS — C7801 Secondary malignant neoplasm of right lung: Secondary | ICD-10-CM | POA: Diagnosis not present

## 2019-11-06 DIAGNOSIS — M7122 Synovial cyst of popliteal space [Baker], left knee: Secondary | ICD-10-CM | POA: Diagnosis not present

## 2019-11-06 DIAGNOSIS — C7931 Secondary malignant neoplasm of brain: Secondary | ICD-10-CM | POA: Diagnosis not present

## 2019-11-06 DIAGNOSIS — I2609 Other pulmonary embolism with acute cor pulmonale: Secondary | ICD-10-CM | POA: Diagnosis not present

## 2019-11-06 DIAGNOSIS — I10 Essential (primary) hypertension: Secondary | ICD-10-CM | POA: Diagnosis not present

## 2019-11-06 DIAGNOSIS — C799 Secondary malignant neoplasm of unspecified site: Secondary | ICD-10-CM | POA: Diagnosis not present

## 2019-11-06 DIAGNOSIS — I2693 Single subsegmental pulmonary embolism without acute cor pulmonale: Secondary | ICD-10-CM | POA: Diagnosis not present

## 2019-11-20 DIAGNOSIS — G4700 Insomnia, unspecified: Secondary | ICD-10-CM | POA: Diagnosis not present

## 2019-11-20 DIAGNOSIS — Z79899 Other long term (current) drug therapy: Secondary | ICD-10-CM | POA: Diagnosis not present

## 2019-11-20 DIAGNOSIS — C7931 Secondary malignant neoplasm of brain: Secondary | ICD-10-CM | POA: Diagnosis not present

## 2019-11-20 DIAGNOSIS — C778 Secondary and unspecified malignant neoplasm of lymph nodes of multiple regions: Secondary | ICD-10-CM | POA: Diagnosis not present

## 2019-11-20 DIAGNOSIS — R188 Other ascites: Secondary | ICD-10-CM | POA: Diagnosis not present

## 2019-11-20 DIAGNOSIS — J9621 Acute and chronic respiratory failure with hypoxia: Secondary | ICD-10-CM | POA: Diagnosis not present

## 2019-11-20 DIAGNOSIS — I2510 Atherosclerotic heart disease of native coronary artery without angina pectoris: Secondary | ICD-10-CM | POA: Diagnosis not present

## 2019-11-20 DIAGNOSIS — R05 Cough: Secondary | ICD-10-CM | POA: Diagnosis not present

## 2019-11-20 DIAGNOSIS — D72829 Elevated white blood cell count, unspecified: Secondary | ICD-10-CM | POA: Diagnosis not present

## 2019-11-20 DIAGNOSIS — E538 Deficiency of other specified B group vitamins: Secondary | ICD-10-CM | POA: Diagnosis not present

## 2019-11-20 DIAGNOSIS — J9811 Atelectasis: Secondary | ICD-10-CM | POA: Diagnosis not present

## 2019-11-20 DIAGNOSIS — C7802 Secondary malignant neoplasm of left lung: Secondary | ICD-10-CM | POA: Diagnosis not present

## 2019-11-20 DIAGNOSIS — I2699 Other pulmonary embolism without acute cor pulmonale: Secondary | ICD-10-CM | POA: Diagnosis not present

## 2019-11-20 DIAGNOSIS — C641 Malignant neoplasm of right kidney, except renal pelvis: Secondary | ICD-10-CM | POA: Diagnosis not present

## 2019-11-20 DIAGNOSIS — J90 Pleural effusion, not elsewhere classified: Secondary | ICD-10-CM | POA: Diagnosis not present

## 2019-11-20 DIAGNOSIS — Z66 Do not resuscitate: Secondary | ICD-10-CM | POA: Diagnosis not present

## 2019-11-20 DIAGNOSIS — I2694 Multiple subsegmental pulmonary emboli without acute cor pulmonale: Secondary | ICD-10-CM | POA: Diagnosis not present

## 2019-11-20 DIAGNOSIS — I700 Atherosclerosis of aorta: Secondary | ICD-10-CM | POA: Diagnosis not present

## 2019-11-20 DIAGNOSIS — Z85528 Personal history of other malignant neoplasm of kidney: Secondary | ICD-10-CM | POA: Diagnosis not present

## 2019-11-20 DIAGNOSIS — I82C11 Acute embolism and thrombosis of right internal jugular vein: Secondary | ICD-10-CM | POA: Diagnosis not present

## 2019-11-20 DIAGNOSIS — C7800 Secondary malignant neoplasm of unspecified lung: Secondary | ICD-10-CM | POA: Diagnosis not present

## 2019-11-20 DIAGNOSIS — R0602 Shortness of breath: Secondary | ICD-10-CM | POA: Diagnosis not present

## 2019-11-20 DIAGNOSIS — C7989 Secondary malignant neoplasm of other specified sites: Secondary | ICD-10-CM | POA: Diagnosis not present

## 2019-11-20 DIAGNOSIS — C7801 Secondary malignant neoplasm of right lung: Secondary | ICD-10-CM | POA: Diagnosis not present

## 2019-11-20 DIAGNOSIS — R000 Tachycardia, unspecified: Secondary | ICD-10-CM | POA: Diagnosis not present

## 2019-11-20 DIAGNOSIS — E43 Unspecified severe protein-calorie malnutrition: Secondary | ICD-10-CM | POA: Diagnosis not present

## 2019-11-20 DIAGNOSIS — R4182 Altered mental status, unspecified: Secondary | ICD-10-CM | POA: Diagnosis not present

## 2019-11-20 DIAGNOSIS — C649 Malignant neoplasm of unspecified kidney, except renal pelvis: Secondary | ICD-10-CM | POA: Diagnosis not present

## 2019-11-20 DIAGNOSIS — J910 Malignant pleural effusion: Secondary | ICD-10-CM | POA: Diagnosis not present

## 2019-11-20 DIAGNOSIS — J9601 Acute respiratory failure with hypoxia: Secondary | ICD-10-CM | POA: Diagnosis not present

## 2019-11-21 DIAGNOSIS — C7800 Secondary malignant neoplasm of unspecified lung: Secondary | ICD-10-CM | POA: Diagnosis not present

## 2019-11-21 DIAGNOSIS — Z85528 Personal history of other malignant neoplasm of kidney: Secondary | ICD-10-CM | POA: Diagnosis not present

## 2019-11-21 DIAGNOSIS — J90 Pleural effusion, not elsewhere classified: Secondary | ICD-10-CM | POA: Diagnosis not present

## 2019-11-24 ENCOUNTER — Telehealth: Payer: Self-pay | Admitting: Internal Medicine

## 2019-11-24 ENCOUNTER — Telehealth: Payer: Self-pay | Admitting: Medical

## 2019-11-24 NOTE — Telephone Encounter (Signed)
Scheduled [facility] Palliative visit for 12-23-19 at 10:00.

## 2019-11-24 NOTE — Telephone Encounter (Signed)
Per Lavella Lemons [facility]  Call Back # [phone]  Home health calling in regards to pt being d/c from [facility], Per Lavella Lemons , Patient's d/c date should be today or tomorrow or this weekend.  Once D/c patient will need order for Nursing, PT and OT.  Please Advise

## 2019-11-25 NOTE — Telephone Encounter (Signed)
Patient is being d/c today or this weekend , orders will be faxed sometime next week .

## 2019-11-25 NOTE — Telephone Encounter (Signed)
If they would fax over the orders I will sign. Also can schedule pt for video visit next week.

## 2019-11-26 NOTE — Telephone Encounter (Signed)
Ok. Will wait for orders and sign when faxed.

## 2019-11-28 ENCOUNTER — Telehealth: Payer: Self-pay | Admitting: Medical

## 2019-11-29 ENCOUNTER — Telehealth: Payer: Self-pay

## 2019-11-29 NOTE — Telephone Encounter (Signed)
Patient's wife(Patrice Withem) called yesterday 11/30/19 stating Jon Reyes needed a hospital bed, I called this AM asking where the order needed to be sent to and she stated her Jon Reyes died yesterday.

## 2019-11-29 NOTE — Telephone Encounter (Signed)
Patient died yesterday no longer a need for hospital bed.

## 2019-12-03 ENCOUNTER — Telehealth: Payer: Self-pay | Admitting: Medical

## 2019-12-03 NOTE — Telephone Encounter (Signed)
Do we have condolence letter to send to family/wife. She lost husband and son within past year.   If you just print standard letter will sign.

## 2019-12-04 NOTE — Telephone Encounter (Signed)
Will order on Wednesday. Give rx for hospital bed when in office on Wednesday.

## 2019-12-04 NOTE — Telephone Encounter (Signed)
Patient's spouse needs a hospital bed, she would like [facility] to place order.    Please Advise

## 2019-12-04 DEATH — deceased

## 2019-12-05 NOTE — Telephone Encounter (Signed)
Sympathy card placed on your desk. Let me know if you prefer a letter.

## 2019-12-23 ENCOUNTER — Other Ambulatory Visit: Payer: BLUE CROSS/BLUE SHIELD | Admitting: Internal Medicine

## 2020-01-13 ENCOUNTER — Encounter: Payer: 59 | Admitting: Medical

## 2020-01-23 IMAGING — DX RIGHT RIBS AND CHEST - 3+ VIEW
3 series · 3 of 3 positions shown · non-contrast
Comparison: Chest x-ray July 15, 2017

CLINICAL DATA: Pain for 2 weeks.

EXAM:
RIGHT RIBS AND CHEST - 3+ VIEW

[chest pa]
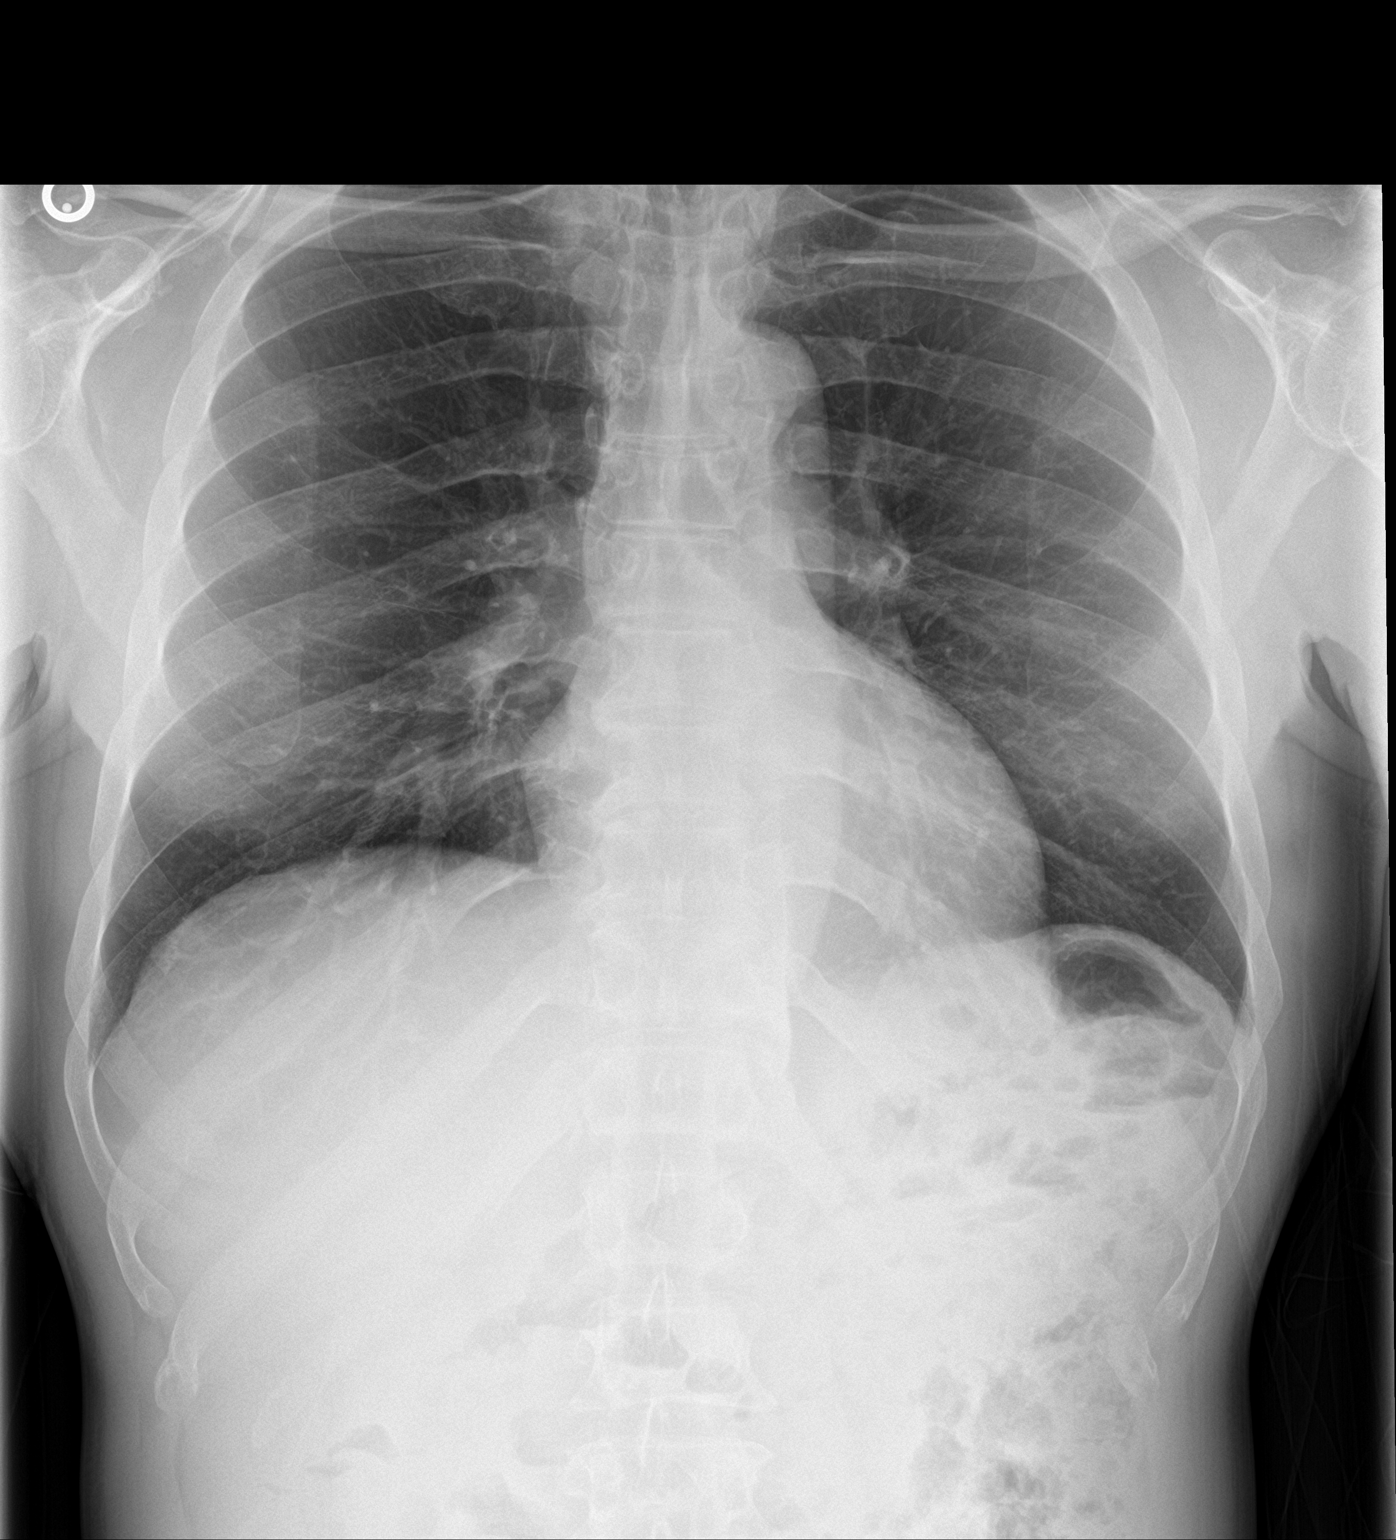

[rib pa]
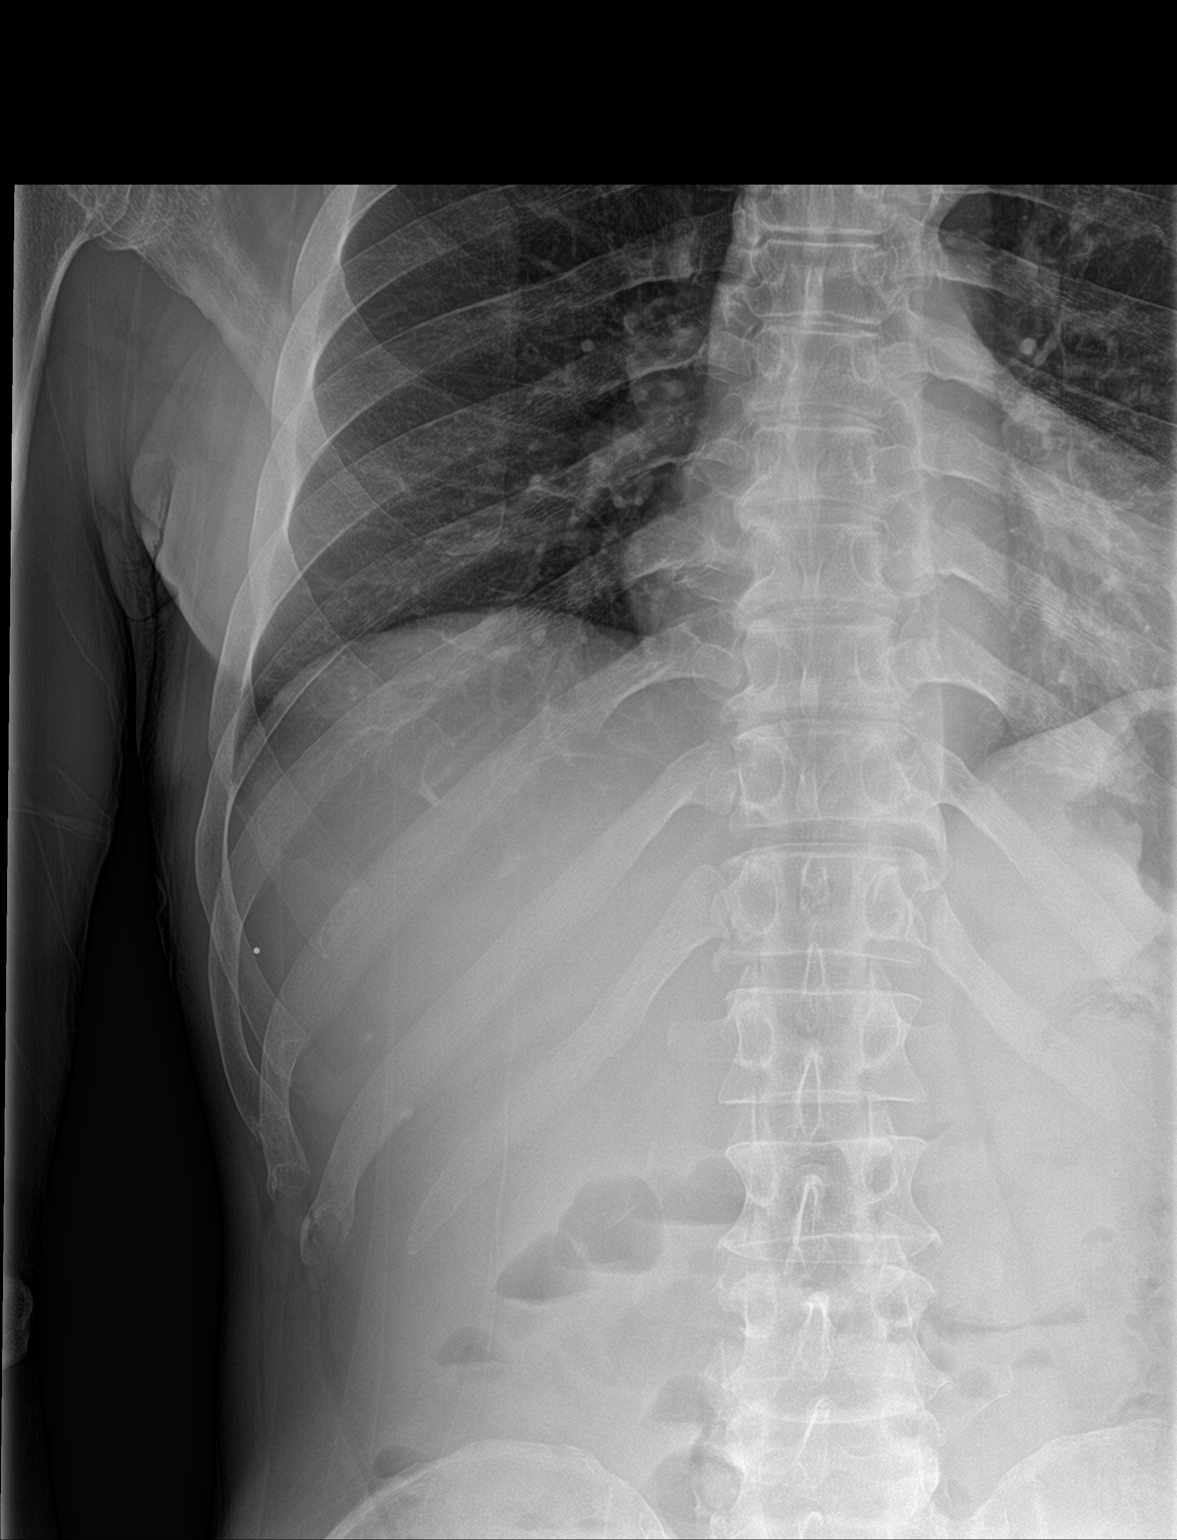

[rib ap obl]
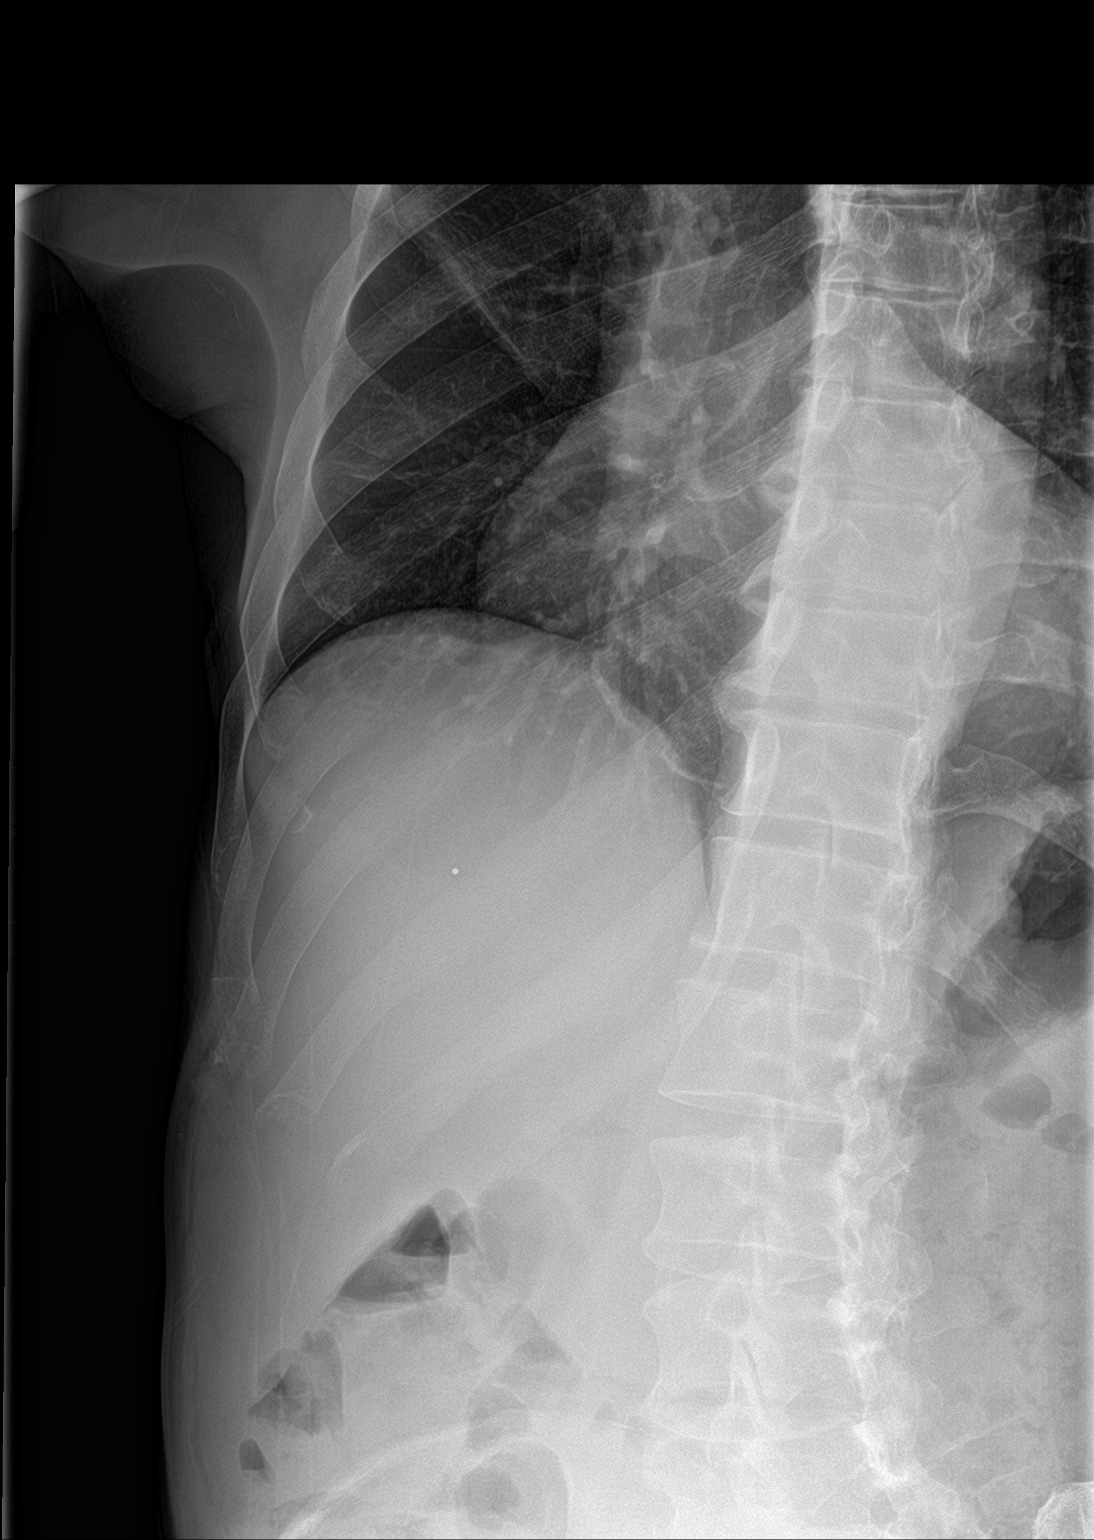

[3 of 3 positions shown; findings below may reference images not displayed]

FINDINGS: The heart, hila, mediastinum, lungs, and pleura are unremarkable. No
pneumothorax. No rib fractures are seen. No other acute
abnormalities.
IMPRESSION: Negative.
